# Patient Record
Sex: Female | Born: 1965
Health system: Southern US, Community
[De-identification: ages and names within clinical notes are randomized; demographics above are authoritative.]

## PROBLEM LIST (undated history)

## (undated) DIAGNOSIS — D649 Anemia, unspecified: Secondary | ICD-10-CM

## (undated) DIAGNOSIS — E785 Hyperlipidemia, unspecified: Secondary | ICD-10-CM

## (undated) HISTORY — DX: Anemia, unspecified: D64.9

## (undated) HISTORY — PX: WISDOM TOOTH EXTRACTION: SHX21

## (undated) HISTORY — PX: COSMETIC SURGERY: SHX468

## (undated) HISTORY — DX: Hyperlipidemia, unspecified: E78.5

---

## 2004-06-16 ENCOUNTER — Ambulatory Visit: Payer: Self-pay | Admitting: Family Medicine

## 2004-08-16 ENCOUNTER — Ambulatory Visit: Payer: Self-pay | Admitting: Family Medicine

## 2004-08-16 ENCOUNTER — Other Ambulatory Visit: Admission: RE | Admit: 2004-08-16 | Discharge: 2004-08-16 | Payer: Self-pay | Admitting: Family Medicine

## 2004-11-16 ENCOUNTER — Ambulatory Visit: Payer: Self-pay | Admitting: Family Medicine

## 2004-11-22 ENCOUNTER — Ambulatory Visit: Payer: Self-pay | Admitting: Family Medicine

## 2005-02-23 ENCOUNTER — Ambulatory Visit: Payer: Self-pay | Admitting: Family Medicine

## 2005-03-14 ENCOUNTER — Ambulatory Visit: Payer: Self-pay | Admitting: Family Medicine

## 2005-04-27 ENCOUNTER — Ambulatory Visit: Payer: Self-pay | Admitting: Family Medicine

## 2005-10-19 ENCOUNTER — Ambulatory Visit: Payer: Self-pay | Admitting: Family Medicine

## 2005-10-26 ENCOUNTER — Ambulatory Visit: Payer: Self-pay | Admitting: Family Medicine

## 2005-10-26 ENCOUNTER — Other Ambulatory Visit: Admission: RE | Admit: 2005-10-26 | Discharge: 2005-10-26 | Payer: Self-pay | Admitting: Family Medicine

## 2005-10-26 LAB — CONVERTED CEMR LAB: Pap Smear: NORMAL

## 2005-12-28 ENCOUNTER — Ambulatory Visit: Payer: Self-pay | Admitting: Family Medicine

## 2006-11-19 ENCOUNTER — Encounter: Payer: Self-pay | Admitting: Family Medicine

## 2006-11-19 DIAGNOSIS — N920 Excessive and frequent menstruation with regular cycle: Secondary | ICD-10-CM | POA: Insufficient documentation

## 2006-11-19 DIAGNOSIS — E7800 Pure hypercholesterolemia, unspecified: Secondary | ICD-10-CM | POA: Insufficient documentation

## 2006-11-19 DIAGNOSIS — D649 Anemia, unspecified: Secondary | ICD-10-CM | POA: Insufficient documentation

## 2006-12-20 ENCOUNTER — Ambulatory Visit: Payer: Self-pay | Admitting: Family Medicine

## 2006-12-20 ENCOUNTER — Other Ambulatory Visit: Admission: RE | Admit: 2006-12-20 | Discharge: 2006-12-20 | Payer: Self-pay | Admitting: Family Medicine

## 2006-12-20 ENCOUNTER — Encounter: Payer: Self-pay | Admitting: Family Medicine

## 2006-12-21 ENCOUNTER — Encounter: Payer: Self-pay | Admitting: Family Medicine

## 2006-12-26 ENCOUNTER — Encounter (INDEPENDENT_AMBULATORY_CARE_PROVIDER_SITE_OTHER): Payer: Self-pay | Admitting: *Deleted

## 2006-12-27 ENCOUNTER — Encounter (INDEPENDENT_AMBULATORY_CARE_PROVIDER_SITE_OTHER): Payer: Self-pay | Admitting: *Deleted

## 2007-12-26 ENCOUNTER — Other Ambulatory Visit: Admission: RE | Admit: 2007-12-26 | Discharge: 2007-12-26 | Payer: Self-pay | Admitting: Family Medicine

## 2007-12-26 ENCOUNTER — Ambulatory Visit: Payer: Self-pay | Admitting: Family Medicine

## 2007-12-26 ENCOUNTER — Encounter: Payer: Self-pay | Admitting: Family Medicine

## 2007-12-28 ENCOUNTER — Encounter: Payer: Self-pay | Admitting: Family Medicine

## 2008-01-07 ENCOUNTER — Encounter: Payer: Self-pay | Admitting: Family Medicine

## 2008-12-28 ENCOUNTER — Encounter: Payer: Self-pay | Admitting: Family Medicine

## 2008-12-28 ENCOUNTER — Other Ambulatory Visit: Admission: RE | Admit: 2008-12-28 | Discharge: 2008-12-28 | Payer: Self-pay | Admitting: Family Medicine

## 2008-12-28 ENCOUNTER — Ambulatory Visit: Payer: Self-pay | Admitting: Family Medicine

## 2008-12-30 ENCOUNTER — Encounter (INDEPENDENT_AMBULATORY_CARE_PROVIDER_SITE_OTHER): Payer: Self-pay | Admitting: *Deleted

## 2009-01-05 ENCOUNTER — Encounter (INDEPENDENT_AMBULATORY_CARE_PROVIDER_SITE_OTHER): Payer: Self-pay | Admitting: *Deleted

## 2009-01-05 ENCOUNTER — Encounter: Payer: Self-pay | Admitting: Family Medicine

## 2010-01-03 ENCOUNTER — Ambulatory Visit: Payer: Self-pay | Admitting: Family Medicine

## 2010-01-03 ENCOUNTER — Other Ambulatory Visit: Admission: RE | Admit: 2010-01-03 | Discharge: 2010-01-03 | Payer: Self-pay | Admitting: Family Medicine

## 2010-01-03 LAB — HM PAP SMEAR

## 2010-01-05 ENCOUNTER — Encounter (INDEPENDENT_AMBULATORY_CARE_PROVIDER_SITE_OTHER): Payer: Self-pay | Admitting: *Deleted

## 2010-01-10 ENCOUNTER — Encounter: Payer: Self-pay | Admitting: Family Medicine

## 2010-01-12 ENCOUNTER — Encounter: Payer: Self-pay | Admitting: Family Medicine

## 2010-01-19 ENCOUNTER — Telehealth: Payer: Self-pay | Admitting: Family Medicine

## 2010-01-24 ENCOUNTER — Encounter: Payer: Self-pay | Admitting: Family Medicine

## 2010-02-08 ENCOUNTER — Encounter (INDEPENDENT_AMBULATORY_CARE_PROVIDER_SITE_OTHER): Payer: Self-pay | Admitting: *Deleted

## 2010-02-08 ENCOUNTER — Ambulatory Visit: Payer: Self-pay | Admitting: Family Medicine

## 2010-03-08 ENCOUNTER — Ambulatory Visit
Admission: RE | Admit: 2010-03-08 | Discharge: 2010-03-08 | Payer: Self-pay | Source: Home / Self Care | Attending: Family Medicine | Admitting: Family Medicine

## 2010-03-14 ENCOUNTER — Telehealth: Payer: Self-pay | Admitting: Family Medicine

## 2010-03-15 ENCOUNTER — Encounter: Payer: Self-pay | Admitting: Family Medicine

## 2010-03-20 LAB — CONVERTED CEMR LAB
Pap Smear: NEGATIVE
Pap Smear: NORMAL

## 2010-03-22 NOTE — Progress Notes (Signed)
Summary: prior auth needed for lipitor  Phone Note From Pharmacy   Caller: [facility] Summary of Call: Prior Berkley Harvey is needed for lipitor, form is on your shelf. Initial call taken by: Lowella Petties CMA, AAMA,  January 19, 2010 4:05 PM  Follow-up for Phone Call        form done and in nurse in box  Follow-up by: Judith Part MD,  January 20, 2010 12:53 PM  Additional Follow-up for Phone Call Additional follow up Details #1::        Form faxed.               Lowella Petties CMA, AAMA  January 21, 2010 8:19 AM  Prior Berkley Harvey given for lipitor, approval note placed on doctor's shelf for signature and scanning. Additional Follow-up by: Lowella Petties CMA, AAMA,  January 24, 2010 12:30 PM

## 2010-03-22 NOTE — Letter (Signed)
Summary: Results Follow up Letter  [place] [facility]  [address] [place], [place] [address]   Phone: [phone]  Fax: [phone]    01/10/2010 MRN: [id number]    [facility] [address] [place], [place]  [address]    Dear Ms. SILVER,  The following are the results of your recent test(s):  Test         Result    Pap Smear:        Normal __X___  Not Normal _____ Comments: ______________________________________________________ Cholesterol: LDL(Bad cholesterol):         Your goal is less than:         HDL (Good cholesterol):       Your goal is more than: Comments:  ______________________________________________________ Mammogram:        Normal _____  Not Normal _____ Comments:  ___________________________________________________________________ Hemoccult:        Normal _____  Not normal _______ Comments:    _____________________________________________________________________ Other Tests:    We routinely do not discuss normal results over the telephone.  If you desire a copy of the results, or you have any questions about this information we can discuss them at your next office visit.   Sincerely,    Idamae Schuller Tower,MD  MT/ri

## 2010-03-22 NOTE — Medication Information (Signed)
Summary: Step Therapy Request for Lipitor/[facility]  Step Therapy Request for Lipitor/[facility]   Imported By: Lanelle Bal 01/20/2010 13:21:49  _____________________________________________________________________  External Attachment:    Type:   Image     Comment:   External Document

## 2010-03-22 NOTE — Assessment & Plan Note (Signed)
Summary: CPX W PAP/DLO   Vital Signs:  Patient profile:   45 year old female Height:      66.5 inches Weight:      183.50 pounds BMI:     29.28 Temp:     98.6 degrees F oral Pulse rate:   80 / minute Pulse rhythm:   regular BP sitting:   130 / 84  (left arm) Cuff size:   large  Vitals Entered By: Lewanda Rife LPN (January 03, 2010 9:47 AM) CC: CPX with pap and breast exam LMP 12/18/09   History of Present Illness: here for wellness exam and gyn care   felt fine until saturday  went on trip to [place] -- and exp to a lot of sick people  stuffy nose and headache -- taking nyquil  no cough yet -- but clears her throat     wt is up 2 lb  bp is 130/84  hx of anemia on nu iron -- cannot do mail order   hx of lipidemia on lipitor  will need labs done for [facility]  pap nl 11/10-- regular and not heavy or painful  no need for birth control   mam 11/10 has mam today-- needs ref   Td 07  flu shot -- had at work   Allergies (verified): No Known Drug Allergies  Past History:  Past Medical History: Last updated: 12/26/2007 Anemia-NOS hyperlipidemia  Past Surgical History: Last updated: 11/19/2006 Caesarean section  Family History: Last updated: 2006-12-25 Father: deceased age 68- stomach cancer Mother: HTN, anemia, chol Siblings great aunt with DM:   Social History: Last updated: 01/03/2010 Marital Status: Married Children: 3 Occupation: [employer] non smoker no alcohol walks 2 mi per day at work  Risk Factors: Smoking Status: never (11/19/2006)  Social History: Marital Status: Married Children: 3 Occupation: [employer] non smoker no alcohol walks 2 mi per day at work  Review of Systems General:  Denies fatigue, fever, loss of appetite, and malaise. Eyes:  Denies blurring and eye irritation. CV:  Denies chest pain or discomfort, lightheadness, and palpitations. Resp:  Denies cough and wheezing. GI:  Denies abdominal pain, bloody stools,  change in bowel habits, indigestion, nausea, and vomiting. GU:  Denies dysuria and urinary frequency. MS:  Denies joint pain, joint redness, joint swelling, and muscle weakness. Derm:  Denies itching, lesion(s), poor wound healing, and rash. Neuro:  Denies numbness and tingling. Psych:  Denies anxiety and depression. Endo:  Denies cold intolerance, excessive thirst, excessive urination, and heat intolerance. Heme:  Denies abnormal bruising and bleeding.  Physical Exam  General:  overweight but generally well appearing  Head:  Normocephalic and atraumatic without obvious abnormalities. mild r sinus tenderness maxillary Eyes:  vision grossly intact, pupils equal, pupils round, and pupils reactive to light.  no conjunctival pallor, injection or icterus  Ears:  R ear normal and L ear normal.   Nose:  nares are injected and congested bilaterally  Mouth:  pharynx pink and moist.  -- some clear post nasal drip Neck:  supple with full rom and no masses or thyromegally, no JVD or carotid bruit  Chest Wall:  No deformities, masses, or tenderness noted. Breasts:  No mass, nodules, thickening, tenderness, bulging, retraction, inflamation, nipple discharge or skin changes noted.   Lungs:  Normal respiratory effort, chest expands symmetrically. Lungs are clear to auscultation, no crackles or wheezes. Heart:  Normal rate and regular rhythm. S1 and S2 normal without gallop, murmur, click, rub or other extra sounds.  Abdomen:  Bowel sounds positive,abdomen soft and non-tender without masses, organomegaly or hernias noted. no renal bruits  Genitalia:  Normal introitus for age, no external lesions, no vaginal discharge, mucosa pink and moist, no vaginal or cervical lesions, no vaginal atrophy, no friaility or hemorrhage, normal uterus size and position, no adnexal masses or tenderness Msk:  No deformity or scoliosis noted of thoracic or lumbar spine.  no acute joint changes  Pulses:  R and L  carotid,radial,femoral,dorsalis pedis and posterior tibial pulses are full and equal bilaterally Extremities:  No clubbing, cyanosis, edema, or deformity noted with normal full range of motion of all joints.   Neurologic:  sensation intact to light touch, gait normal, and DTRs symmetrical and normal.   Skin:  Intact without suspicious lesions or rashes baseline skin tags neck and nevi on face Cervical Nodes:  No lymphadenopathy noted Axillary Nodes:  No palpable lymphadenopathy Inguinal Nodes:  No significant adenopathy Psych:  normal affect, talkative and pleasant    Impression & Recommendations:  Problem # 1:  HEALTH MAINTENANCE EXAM (ICD-V70.0) Assessment Comment Only  reviewed health habits including diet, exercise and skin cancer prevention reviewed health maintenance list and family history  lab today  Orders: Venipuncture ([address])  Problem # 2:  GYNECOLOGICAL EXAMINATION, ROUTINE (ICD-V72.31) Assessment: Comment Only annual exam with pap nl menses  no Oc if nl - will be able to start spacing out exams   Problem # 3:  ANEMIA-NOS (ICD-285.9) Assessment: Unchanged  continue current dose - from menses loss refil done lab through [facility] to draw today cbc  Her updated medication list for this problem includes:    Nu-iron 150 Mg Caps (Polysaccharide iron complex) ..... One by mouth once daily  Orders: Venipuncture ([address]) Prescription Created Electronically [phone])  Problem # 4:  Hx of HYPERCHOLESTEROLEMIA (ICD-272.0) Assessment: Unchanged  has been controlled with lipitor and diet  lab to [facility] today  rev low sat fat diet and exercise  Her updated medication list for this problem includes:    Lipitor 10 Mg Tabs (Atorvastatin calcium) ..... One by mouth once daily  Orders: Venipuncture ([address])  Problem # 5:  OTHER SCREENING MAMMOGRAM (ICD-V76.12) Assessment: Comment Only  annual mammogram scheduled adv pt to continue regular self breast exams non  remarkable breast exam today   Orders: Radiology Referral (Radiology)  Complete Medication List: 1)  Lipitor 10 Mg Tabs (Atorvastatin calcium) .... One by mouth once daily 2)  Nu-iron 150 Mg Caps (Polysaccharide iron complex) .... One by mouth once daily  Patient Instructions: 1)  please draw comp met, cbc with diff, tsh , lipids v70.0 and 272 and send to [facility] today  2)  you can try mucinex over the counter twice daily as directed and nasal saline spray for congestion 3)  tylenol over the counter as directed may help with aches, headache and fever 4)  call if symptoms worsen or if not improved in 4-5 days  5)  we will schedule mammogram at check out  Prescriptions: LIPITOR 10 MG  TABS (ATORVASTATIN CALCIUM) one by mouth once daily  #90 x 3   Entered and Authorized by:   Judith Part MD   Signed by:   Judith Part MD on 01/03/2010   Method used:   Print then Give to Patient   RxID:   [id number] NU-IRON 150 MG  CAPS (POLYSACCHARIDE IRON COMPLEX) one by mouth once daily  #30 x 11   Entered and Authorized by:   Colon Flattery  Tower MD   Signed by:   Judith Part MD on 01/03/2010   Method used:   Electronically to        [facility] [id number][facility] (retail)       [address] [place], [place]  [address]       Ph: [phone] or [phone]       Fax: [phone]   RxID:   [id number]    Orders Added: 1)  Radiology Referral [Radiology] 2)  Venipuncture [[address]] 3)  Prescription Created Electronically [G8553] 4)  Est. Patient 40-64 years [99396]   Immunization History:  Influenza Immunization History:    Influenza:  historical received at work (12/01/2009)   Immunization History:  Influenza Immunization History:    Influenza:  Historical received at work (12/01/2009)  Current Allergies (reviewed today): No known allergies   Appended Document: CPX W PAP/DLO

## 2010-03-22 NOTE — Letter (Signed)
Summary: Results Follow up Letter   at [facility]  [address] [place], [place] [address]   Phone: [phone]  Fax: [phone]    01/05/2010 MRN: [id number]     [facility] [address] [place], [place]  [address]    Dear Leslie Gomez,  The following are the results of your recent test(s):  Test         Result    Pap Smear:        Normal _____  Not Normal _____ Comments: ______________________________________________________ Cholesterol: LDL(Bad cholesterol):         Your goal is less than:         HDL (Good cholesterol):       Your goal is more than: Comments:  ______________________________________________________ Mammogram:        Normal __x___  Not Normal _____ Comments:Repeat in 1 year  ___________________________________________________________________ Hemoccult:        Normal _____  Not normal _______ Comments:    _____________________________________________________________________ Other Tests:    We routinely do not discuss normal results over the telephone.  If you desire a copy of the results, or you have any questions about this information we can discuss them at your next office visit.   Sincerely,  Roxy Manns MD

## 2010-03-24 NOTE — Medication Information (Signed)
Summary: Prior Authorization and Approval for Lipitor  Prior Authorization and Approval for Lipitor   Imported By: Maryln Gottron 01/31/2010 15:32:43  _____________________________________________________________________  External Attachment:    Type:   Image     Comment:   External Document

## 2010-03-24 NOTE — Progress Notes (Signed)
  Phone Note Outgoing Call   Call placed by: Terri Call placed to: Patient Summary of Call: FYI, Patient will be coming in tomorrow for CBC redraw, [facility] lost all blood samples from 03/08/2010, Terri Initial call taken by: Mills Koller,  March 14, 2010 3:24 PM  Follow-up for Phone Call        thanks for the update -- what's the deal with [facility] lately? Follow-up by: Judith Part MD,  March 14, 2010 4:22 PM  Additional Follow-up for Phone Call Additional follow up Details #1::        I don't know. I have a call in to our rep. Thanks, Camelia Eng

## 2010-03-24 NOTE — Letter (Signed)
Summary: [place] No Show Letter  [place] at [facility]  [address] [place], [place] [address]   Phone: [phone]  Fax: [phone]    02/08/2010 MRN: [id number]  [facility] [address] [place], [place]  [address]   Dear Ms. SILVER,   Our records indicate that you missed your scheduled appointment with ___lab__________________ on _12.20.2011___________.  Please contact this office to reschedule your appointment as soon as possible.  It is important that you keep your scheduled appointments with your physician, so we can provide you the best care possible.  Please be advised that there may be a charge for "no show" appointments.    Sincerely,   [place] at [facility]

## 2010-04-21 ENCOUNTER — Other Ambulatory Visit: Payer: Self-pay

## 2010-05-02 ENCOUNTER — Other Ambulatory Visit (INDEPENDENT_AMBULATORY_CARE_PROVIDER_SITE_OTHER): Payer: 59

## 2010-05-02 ENCOUNTER — Encounter: Payer: Self-pay | Admitting: Family Medicine

## 2010-05-02 ENCOUNTER — Encounter: Payer: Self-pay | Admitting: *Deleted

## 2010-05-02 DIAGNOSIS — D649 Anemia, unspecified: Secondary | ICD-10-CM

## 2011-01-02 ENCOUNTER — Telehealth: Payer: Self-pay | Admitting: *Deleted

## 2011-01-02 DIAGNOSIS — Z1231 Encounter for screening mammogram for malignant neoplasm of breast: Secondary | ICD-10-CM

## 2011-01-02 NOTE — Telephone Encounter (Signed)
Patient needs a referral to [facility] for her mammogram. Patient has an appointment scheduled for 01/16/11 for her mammogram. Please let her know that this has been done.

## 2011-01-03 NOTE — Telephone Encounter (Signed)
We do not use [facility] anymore (let person know who took message)- ask her if [facility] is ok or [place] thanks

## 2011-01-04 DIAGNOSIS — Z1231 Encounter for screening mammogram for malignant neoplasm of breast: Secondary | ICD-10-CM | POA: Insufficient documentation

## 2011-01-04 NOTE — Telephone Encounter (Signed)
Patient notified as instructed by telephone. Pt said she would go to [facility] at [facility] but she has taken the week of 01/16/11 off to take care of doctors appts and testing that needs to be done and pt wants mammogram appt that week. . Pt will wait to hear from pt care coordinator.Pt can be reached at [phone].

## 2011-01-04 NOTE — Telephone Encounter (Signed)
Will do mam ref

## 2011-01-09 ENCOUNTER — Telehealth: Payer: Self-pay | Admitting: Family Medicine

## 2011-01-09 DIAGNOSIS — E7800 Pure hypercholesterolemia, unspecified: Secondary | ICD-10-CM

## 2011-01-09 DIAGNOSIS — Z0000 Encounter for general adult medical examination without abnormal findings: Secondary | ICD-10-CM

## 2011-01-09 NOTE — Telephone Encounter (Signed)
Message copied by Judy Pimple on Mon Jan 09, 2011  9:39 PM ------      Message from: Alvina Chou      Created: Mon Jan 09, 2011  9:03 AM      Regarding: Labs for wednesday 11.21.12       Patient is scheduled for CPX labs, please order future labs, Thanks , Camelia Eng

## 2011-01-11 ENCOUNTER — Other Ambulatory Visit (INDEPENDENT_AMBULATORY_CARE_PROVIDER_SITE_OTHER): Payer: 59

## 2011-01-11 DIAGNOSIS — E7800 Pure hypercholesterolemia, unspecified: Secondary | ICD-10-CM

## 2011-01-11 DIAGNOSIS — Z0000 Encounter for general adult medical examination without abnormal findings: Secondary | ICD-10-CM

## 2011-01-11 DIAGNOSIS — D649 Anemia, unspecified: Secondary | ICD-10-CM

## 2011-01-16 ENCOUNTER — Encounter: Payer: Self-pay | Admitting: Family Medicine

## 2011-01-16 ENCOUNTER — Encounter: Payer: 59 | Admitting: Family Medicine

## 2011-01-17 ENCOUNTER — Other Ambulatory Visit (HOSPITAL_COMMUNITY)
Admission: RE | Admit: 2011-01-17 | Discharge: 2011-01-17 | Disposition: A | Discharge status: Home / Self Care | Payer: 59 | Source: Ambulatory Visit | Attending: Family Medicine | Admitting: Family Medicine

## 2011-01-17 ENCOUNTER — Encounter: Payer: Self-pay | Admitting: Family Medicine

## 2011-01-17 ENCOUNTER — Ambulatory Visit (INDEPENDENT_AMBULATORY_CARE_PROVIDER_SITE_OTHER): Payer: 59 | Admitting: Family Medicine

## 2011-01-17 VITALS — BP 130/84 | HR 72 | Temp 98.3°F | Ht 66.5 in | Wt 183.0 lb

## 2011-01-17 DIAGNOSIS — D649 Anemia, unspecified: Secondary | ICD-10-CM

## 2011-01-17 DIAGNOSIS — Z01419 Encounter for gynecological examination (general) (routine) without abnormal findings: Secondary | ICD-10-CM | POA: Insufficient documentation

## 2011-01-17 DIAGNOSIS — Z1231 Encounter for screening mammogram for malignant neoplasm of breast: Secondary | ICD-10-CM

## 2011-01-17 DIAGNOSIS — E7800 Pure hypercholesterolemia, unspecified: Secondary | ICD-10-CM

## 2011-01-17 DIAGNOSIS — K219 Gastro-esophageal reflux disease without esophagitis: Secondary | ICD-10-CM

## 2011-01-17 DIAGNOSIS — Z0000 Encounter for general adult medical examination without abnormal findings: Secondary | ICD-10-CM

## 2011-01-17 MED ORDER — ATORVASTATIN CALCIUM 10 MG PO TABS: 10.0000 mg | ORAL_TABLET | Freq: Every day | ORAL | Status: DC

## 2011-01-17 MED ORDER — RANITIDINE HCL 150 MG PO TABS: 150.0000 mg | ORAL_TABLET | Freq: Two times a day (BID) | ORAL | Status: DC

## 2011-01-17 MED ORDER — POLYSACCHARIDE IRON COMPLEX 150 MG PO CAPS: 150.0000 mg | ORAL_CAPSULE | Freq: Two times a day (BID) | ORAL | Status: DC

## 2011-01-17 NOTE — Patient Instructions (Signed)
Take the zantac 150 mg twice daily for heartburn  If not improved- let me know Cholesterol is great  Please try to quit sodas Anemia is the same -- if at any time you want to see hematology -- let me know  Follow up with me in 3 months for the heartburn problem

## 2011-01-17 NOTE — Assessment & Plan Note (Signed)
Mam utd - pend report from yesterday  Nl breast exam  Enc self exams

## 2011-01-17 NOTE — Assessment & Plan Note (Signed)
Strongly suspect iron abs issue -as this runs in family Pt declines any further eval as she feels fine - I did enc her to call if she changes her mind Will continue to follow  Continue nu iron

## 2011-01-17 NOTE — Assessment & Plan Note (Signed)
Reviewed health habits including diet and exercise and skin cancer prevention Also reviewed health mt list, fam hx and immunizations  Rev wellness lab in detail 

## 2011-01-17 NOTE — Assessment & Plan Note (Signed)
New gerd symptoms - has fam hx of stomach cancer - so emph imp of tx this  Will start zantac 150 bid and f/u 3 mo (call if not imp before then) Also plans to quit soda completely and work on wt loss

## 2011-01-17 NOTE — Assessment & Plan Note (Signed)
Good control with lipitor and diet  Rev labs from [facility] Disc low sat fat diet  Refilled lipitor

## 2011-01-17 NOTE — Assessment & Plan Note (Signed)
Annual exam with pap Menses not as heavy Does not want OC

## 2011-01-17 NOTE — Progress Notes (Signed)
Subjective:    Patient ID: Leslie Gomez, female    DOB: May 09, 1965, 45 y.o.   MRN: [id number]  HPI Here for annual health mt exam/gyn care and also to rev chronic med probs incl hyperlipidemia and anemia  Is feeling good overall   New problem-- getting heartburn more often -- no pattern to it  Never had it before  Does not think she is having it more than twice per week Father had stomach cancer She takes excedrin at most one dose every 2 week  Diet has not changed No spicy food  Does drink mt dew and pepsi -- regular  Is not burping up acid or tasting in her mouth  Feels it in chest up to her neck - burning feeling  Does not clear throat a lot of cough  Some stress- life changes , kids getting older    bp is 130/84 Wt is stable with bmi of 29  Hx of heavy menses Still the same - last 3 days maxiumum -- every once in a while quite heavy Pap nl 11/11  Mam 11/10 normal -- says she did have one last year  Self exam   Flu shot Td 07  On lipitor  Lipids well controlled Diet HDL was 57 andLDL 74   On nu iron hb is stable at 10.8-- per pt that is not bad  Offered heme consult in past to investigate poss iron abs disorder  Mother has iron abs problem  She does not want to investigate hers   Patient Active Problem List  Diagnoses  . HYPERCHOLESTEROLEMIA  . ANEMIA-NOS  . EXCESSIVE MENSTRUATION  . Other screening mammogram  . Routine general medical examination at a health care facility  . Gynecological examination  . GERD (gastroesophageal reflux disease)   Past Medical History  Diagnosis Date  . Anemia     NOS since childhood runs in family  . Hyperlipidemia    Past Surgical History  Procedure Date  . Cesarean section    History  Substance Use Topics  . Smoking status: Never Smoker   . Smokeless tobacco: Not on file  . Alcohol Use: No   Family History  Problem Relation Age of Onset  . Hyperlipidemia Mother   . Hypertension Mother   . Anemia Mother     . Cancer Father     stomach CA   No Known Allergies No current outpatient prescriptions on file prior to visit.       Review of Systems Review of Systems  Constitutional: Negative for fever, appetite change, fatigue and unexpected weight change.  Eyes: Negative for pain and visual disturbance.  Respiratory: Negative for cough and shortness of breath.   Cardiovascular: Negative for cp or palpitations    Gastrointestinal: Negative for nausea, diarrhea and constipation. pos for heartburn Genitourinary: Negative for urgency and frequency.  Skin: Negative for pallor or rash   Neurological: Negative for weakness, light-headedness, numbness and headaches.  Hematological: Negative for adenopathy. Does not bruise/bleed easily.  Psychiatric/Behavioral: Negative for dysphoric mood. The patient is not nervous/anxious.          Objective:   Physical Exam  Constitutional: She appears well-developed and well-nourished. No distress.  HENT:  Head: Normocephalic and atraumatic.  Right Ear: External ear normal.  Left Ear: External ear normal.  Nose: Nose normal.  Mouth/Throat: Oropharynx is clear and moist.  Eyes: Conjunctivae and EOM are normal. Pupils are equal, round, and reactive to light.  Neck: Normal range of motion. Neck  supple. No JVD present. Carotid bruit is not present. No thyromegaly present.  Cardiovascular: Normal rate, regular rhythm, normal heart sounds and intact distal pulses.  Exam reveals no gallop.   Pulmonary/Chest: Effort normal and breath sounds normal. No respiratory distress. She has no wheezes.  Abdominal: Soft. Bowel sounds are normal. She exhibits no distension, no abdominal bruit and no mass. There is no tenderness.  Genitourinary: Vagina normal and uterus normal. No breast swelling, tenderness, discharge or bleeding. No vaginal discharge found.       No M on breast exam  Lipoma like lumps in bilat axillae- baseline    Musculoskeletal: Normal range of motion.  She exhibits no edema and no tenderness.  Lymphadenopathy:    She has no cervical adenopathy.  Neurological: She is alert. She has normal reflexes. No cranial nerve deficit. She exhibits normal muscle tone. Coordination normal.  Skin: Skin is warm and dry. No rash noted. No erythema. No pallor.  Psychiatric: She has a normal mood and affect.          Assessment & Plan:

## 2011-01-26 ENCOUNTER — Encounter: Payer: Self-pay | Admitting: *Deleted

## 2011-04-06 ENCOUNTER — Encounter: Payer: Self-pay | Admitting: Family Medicine

## 2011-04-12 ENCOUNTER — Encounter: Payer: Self-pay | Admitting: Family Medicine

## 2011-04-12 ENCOUNTER — Ambulatory Visit (INDEPENDENT_AMBULATORY_CARE_PROVIDER_SITE_OTHER): Payer: 59 | Admitting: Family Medicine

## 2011-04-12 VITALS — BP 134/84 | HR 80 | Temp 98.4°F | Ht 66.5 in | Wt 184.2 lb

## 2011-04-12 DIAGNOSIS — K219 Gastro-esophageal reflux disease without esophagitis: Secondary | ICD-10-CM

## 2011-04-12 DIAGNOSIS — E669 Obesity, unspecified: Secondary | ICD-10-CM | POA: Insufficient documentation

## 2011-04-12 NOTE — Assessment & Plan Note (Signed)
Controlled with no sodas  Has not needed zantac  Wt loss will also help Rev lifestyle change

## 2011-04-12 NOTE — Progress Notes (Signed)
Subjective:    Patient ID: Leslie Gomez, female    DOB: [birth year], 46 y.o.   MRN: [id number]  HPI Here for f/u of acid reflux  No more symtoms at all Never started the zantac -- she just quit sodas completely  Misses them but doing well   No wt change  Wants to loose Is starting to change what she eats - more fruit and veg  Has not cut portions yet  Does not snack Needs to exercise   Patient Active Problem List  Diagnoses  . HYPERCHOLESTEROLEMIA  . ANEMIA-NOS  . EXCESSIVE MENSTRUATION  . Other screening mammogram  . Routine general medical examination at a health care facility  . Gynecological examination  . GERD (gastroesophageal reflux disease)  . Obesity   Past Medical History  Diagnosis Date  . Anemia     NOS since childhood runs in family  . Hyperlipidemia    Past Surgical History  Procedure Date  . Cesarean section    History  Substance Use Topics  . Smoking status: Never Smoker   . Smokeless tobacco: Not on file  . Alcohol Use: No   Family History  Problem Relation Age of Onset  . Hyperlipidemia Mother   . Hypertension Mother   . Anemia Mother   . Cancer Father     stomach CA   No Known Allergies Current Outpatient Prescriptions on File Prior to Visit  Medication Sig Dispense Refill  . atorvastatin (LIPITOR) 10 MG tablet Take 1 tablet (10 mg total) by mouth daily.  90 tablet  3  . iron polysaccharides (NU-IRON) 150 MG capsule Take 1 capsule (150 mg total) by mouth 2 (two) times daily.  180 capsule  3  . ranitidine (ZANTAC) 150 MG tablet Take 1 tablet (150 mg total) by mouth 2 (two) times daily.  60 tablet  11        Review of Systems Review of Systems  Constitutional: Negative for fever, appetite change, fatigue and unexpected weight change.  Eyes: Negative for pain and visual disturbance.  Respiratory: Negative for cough and shortness of breath.   Cardiovascular: Negative for cp or palpitations    Gastrointestinal: Negative for nausea,  diarrhea and constipation.neg for heartburn or epigastric pain   Genitourinary: Negative for urgency and frequency.  Skin: Negative for pallor or rash   Neurological: Negative for weakness, light-headedness, numbness and headaches.  Hematological: Negative for adenopathy. Does not bruise/bleed easily.  Psychiatric/Behavioral: Negative for dysphoric mood. The patient is not nervous/anxious.          Objective:   Physical Exam  Constitutional: She appears well-developed and well-nourished. No distress.       Obese and well appearing   HENT:  Head: Normocephalic and atraumatic.  Mouth/Throat: Oropharynx is clear and moist.  Eyes: Conjunctivae and EOM are normal. Pupils are equal, round, and reactive to light. No scleral icterus.  Neck: Normal range of motion. Neck supple. No JVD present. No thyromegaly present.  Cardiovascular: Normal rate, regular rhythm and normal heart sounds.   Pulmonary/Chest: Effort normal and breath sounds normal. No respiratory distress. She has no wheezes.  Abdominal: Soft. Bowel sounds are normal. She exhibits no distension and no mass. There is no tenderness.  Musculoskeletal: She exhibits no edema.  Lymphadenopathy:    She has no cervical adenopathy.  Neurological: She is alert.  Skin: Skin is warm and dry. No rash noted. No erythema. No pallor.  Psychiatric: She has a normal mood and affect.  Assessment & Plan:

## 2011-04-12 NOTE — Patient Instructions (Signed)
I'm glad your reflux is better  Stay off soda For weight loss- eat smart (no junk), cut portions by 1/4 and also incorporate exercise 5 days per week  Weight watchers is a good option  There are also some good apps - like my fitness pal Update me if symptoms return  Use zantac as needed

## 2011-04-12 NOTE — Assessment & Plan Note (Signed)
Discussed how this problem influences overall health and the risks it imposes  Reviewed plan for weight loss with lower calorie diet (via better food choices and also portion control or program like weight watchers) and exercise building up to or more than 30 minutes 5 days per week including some aerobic activity   Pt is motivated for change

## 2011-04-19 ENCOUNTER — Ambulatory Visit: Payer: 59 | Admitting: Family Medicine

## 2011-08-29 ENCOUNTER — Telehealth: Payer: Self-pay

## 2011-08-29 DIAGNOSIS — Z1231 Encounter for screening mammogram for malignant neoplasm of breast: Secondary | ICD-10-CM

## 2011-08-29 NOTE — Telephone Encounter (Signed)
Not sure if they will accept ref this far ahead of time- but will do it and see

## 2011-08-29 NOTE — Telephone Encounter (Signed)
Pt request referral for mammogram in Dec 2013. Pt will wait to hear from pt care coordinator about appt.Please advise.

## 2012-01-21 ENCOUNTER — Telehealth: Payer: Self-pay | Admitting: Family Medicine

## 2012-01-21 DIAGNOSIS — Z0000 Encounter for general adult medical examination without abnormal findings: Secondary | ICD-10-CM

## 2012-01-21 DIAGNOSIS — E7800 Pure hypercholesterolemia, unspecified: Secondary | ICD-10-CM

## 2012-01-21 DIAGNOSIS — D649 Anemia, unspecified: Secondary | ICD-10-CM

## 2012-01-21 NOTE — Telephone Encounter (Signed)
Message copied by Judy Pimple on Sun Jan 21, 2012 10:07 AM ------      Message from: Leslie Gomez      Created: Thu Jan 11, 2012 10:32 AM      Regarding: Cpx labs 12/2 Mon       Please order  future cpx labs for pt's upcomming lab appt.      Thanks      Rodney Booze

## 2012-01-22 ENCOUNTER — Other Ambulatory Visit (INDEPENDENT_AMBULATORY_CARE_PROVIDER_SITE_OTHER): Payer: 59

## 2012-01-22 DIAGNOSIS — D649 Anemia, unspecified: Secondary | ICD-10-CM

## 2012-01-22 DIAGNOSIS — E7800 Pure hypercholesterolemia, unspecified: Secondary | ICD-10-CM

## 2012-01-22 DIAGNOSIS — Z0000 Encounter for general adult medical examination without abnormal findings: Secondary | ICD-10-CM

## 2012-01-23 LAB — COMPREHENSIVE METABOLIC PANEL
Albumin: 4.4 g/dL (ref 3.5–5.5)
BUN: 15 mg/dL (ref 6–24)
CO2: 20 mmol/L (ref 19–28)
Chloride: 102 mmol/L (ref 97–108)
Creatinine, Ser: 0.83 mg/dL (ref 0.57–1.00)
GFR calc Af Amer: 98 mL/min/{1.73_m2} (ref >59)
Globulin, Total: 2.9 g/dL (ref 1.5–4.5)
Glucose: 89 mg/dL (ref 65–99)
Total Protein: 7.3 g/dL (ref 6.0–8.5)

## 2012-01-23 LAB — FERRITIN: Ferritin: 23 ng/mL (ref 15–150)

## 2012-01-23 LAB — LIPID PANEL WITH LDL/HDL RATIO
Cholesterol, Total: 173 mg/dL (ref 100–199)
HDL: 74 mg/dL
LDL Calculated: 88 mg/dL (ref 0–99)
LDl/HDL Ratio: 1.2 ratio (ref 0.0–3.2)
Triglycerides: 53 mg/dL (ref 0–149)
VLDL Cholesterol Cal: 11 mg/dL (ref 5–40)

## 2012-01-23 LAB — CBC WITH DIFFERENTIAL/PLATELET
Basophils Absolute: 0 10*3/uL (ref 0.0–0.2)
Eos: 1 % (ref 0–5)
HCT: 35.8 % (ref 34.0–46.6)
Hemoglobin: 11.1 g/dL (ref 11.1–15.9)
Immature Granulocytes: 0 % (ref 0–2)
Lymphocytes Absolute: 1.9 10*3/uL (ref 0.7–3.1)
Lymphs: 32 % (ref 14–46)
MCHC: 31 g/dL — ABNORMAL LOW (ref 31.5–35.7)
Monocytes: 5 % (ref 4–12)
Neutrophils Absolute: 3.6 10*3/uL (ref 1.4–7.0)
WBC: 5.8 10*3/uL (ref 3.4–10.8)

## 2012-01-23 LAB — TSH: TSH: 2.35 u[IU]/mL (ref 0.450–4.500)

## 2012-01-24 ENCOUNTER — Ambulatory Visit: Payer: Self-pay | Admitting: Family Medicine

## 2012-01-24 ENCOUNTER — Encounter: Payer: Self-pay | Admitting: Family Medicine

## 2012-01-24 ENCOUNTER — Ambulatory Visit (INDEPENDENT_AMBULATORY_CARE_PROVIDER_SITE_OTHER): Payer: 59 | Admitting: Family Medicine

## 2012-01-24 ENCOUNTER — Ambulatory Visit: Payer: 59

## 2012-01-24 ENCOUNTER — Other Ambulatory Visit (HOSPITAL_COMMUNITY)
Admission: RE | Admit: 2012-01-24 | Discharge: 2012-01-24 | Disposition: A | Discharge status: Home / Self Care | Payer: 59 | Source: Ambulatory Visit | Attending: Family Medicine | Admitting: Family Medicine

## 2012-01-24 VITALS — BP 132/84 | HR 68 | Temp 99.1°F | Ht 66.5 in | Wt 162.8 lb

## 2012-01-24 DIAGNOSIS — Z01419 Encounter for gynecological examination (general) (routine) without abnormal findings: Secondary | ICD-10-CM | POA: Insufficient documentation

## 2012-01-24 DIAGNOSIS — Z0000 Encounter for general adult medical examination without abnormal findings: Secondary | ICD-10-CM

## 2012-01-24 DIAGNOSIS — E7800 Pure hypercholesterolemia, unspecified: Secondary | ICD-10-CM

## 2012-01-24 DIAGNOSIS — D649 Anemia, unspecified: Secondary | ICD-10-CM

## 2012-01-24 DIAGNOSIS — Z1231 Encounter for screening mammogram for malignant neoplasm of breast: Secondary | ICD-10-CM

## 2012-01-24 MED ORDER — ATORVASTATIN CALCIUM 10 MG PO TABS: 10.0000 mg | ORAL_TABLET | Freq: Every day | ORAL | Status: DC

## 2012-01-24 MED ORDER — POLYSACCHARIDE IRON COMPLEX 150 MG PO CAPS: 150.0000 mg | ORAL_CAPSULE | Freq: Two times a day (BID) | ORAL | Status: DC

## 2012-01-24 NOTE — Progress Notes (Signed)
Subjective:    Patient ID: Leslie Gomez, female    DOB: 06-Nov-1965, 46 y.o.   MRN: [id number]  HPI Here for health maintenance exam and to review chronic medical problems    Feeling ok  Nothing new going on   Wt is down 22 lb since last feb Has been working hard on it  Total loss 27 lb  Using my fitness pal app-- tracking calories and walks 2-3 miles per day Is very pleased and feels better  No longer obese bmi is 25  Flu vaccine- had it in oct   Pap 11/12- doing that today Menses- are not as heavy as they were , and regular  No OC -and does not want to be Not trying to get pregnant   mammo 11/12 Has today at the [facility]  Self exam- no lumps or changes   Hyperlipidemia  On lipitor and diet No results found for this basename: CHOL   Lab Results  Component Value Date   HDL 74 01/22/2012   Lab Results  Component Value Date   LDLCALC 88 01/22/2012   Lab Results  Component Value Date   TRIG 53 01/22/2012   No results found for this basename: CHOLHDL   No results found for this basename: LDLDIRECT     Hx of anemia Lab Results  Component Value Date   WBC 5.8 01/22/2012   HGB 11.1 01/22/2012   HCT 35.8 01/22/2012   MCV 78* 01/22/2012   is still taking her iron Feels fine  Ferritin 23  Patient Active Problem List  Diagnosis  . HYPERCHOLESTEROLEMIA  . ANEMIA-NOS  . EXCESSIVE MENSTRUATION  . Other screening mammogram  . Routine general medical examination at a health care facility  . Routine gynecological examination  . GERD (gastroesophageal reflux disease)   Past Medical History  Diagnosis Date  . Anemia     NOS since childhood runs in family  . Hyperlipidemia    Past Surgical History  Procedure Date  . Cesarean section    History  Substance Use Topics  . Smoking status: Never Smoker   . Smokeless tobacco: Not on file  . Alcohol Use: No   Family History  Problem Relation Age of Onset  . Hyperlipidemia Mother   . Hypertension  Mother   . Anemia Mother   . Cancer Father     stomach CA   No Known Allergies Current Outpatient Prescriptions on File Prior to Visit  Medication Sig Dispense Refill  . atorvastatin (LIPITOR) 10 MG tablet Take 1 tablet (10 mg total) by mouth daily.  90 tablet  3  . iron polysaccharides (NU-IRON) 150 MG capsule Take 1 capsule (150 mg total) by mouth 2 (two) times daily.  180 capsule  3  . ranitidine (ZANTAC) 150 MG tablet Take 1 tablet (150 mg total) by mouth 2 (two) times daily.  60 tablet  11       Review of Systems Review of Systems  Constitutional: Negative for fever, appetite change, fatigue and unexpected weight change.  Eyes: Negative for pain and visual disturbance.  Respiratory: Negative for cough and shortness of breath.   Cardiovascular: Negative for cp or palpitations    Gastrointestinal: Negative for nausea, diarrhea and constipation.  Genitourinary: Negative for urgency and frequency.  Skin: Negative for pallor or rash   Neurological: Negative for weakness, light-headedness, numbness and headaches.  Hematological: Negative for adenopathy. Does not bruise/bleed easily.  Psychiatric/Behavioral: Negative for dysphoric mood. The patient is not nervous/anxious.  Objective:   Physical Exam  Constitutional: She appears well-developed and well-nourished. No distress.  HENT:  Head: Normocephalic and atraumatic.  Right Ear: External ear normal.  Left Ear: External ear normal.  Nose: Nose normal.  Mouth/Throat: Oropharynx is clear and moist.  Eyes: Conjunctivae normal and EOM are normal. Pupils are equal, round, and reactive to light. Right eye exhibits no discharge. Left eye exhibits no discharge.  Neck: Normal range of motion. Neck supple. No JVD present. Carotid bruit is not present. No thyromegaly present.  Cardiovascular: Normal rate, regular rhythm, normal heart sounds and intact distal pulses.  Exam reveals no gallop.   Pulmonary/Chest: Effort normal and  breath sounds normal. No respiratory distress. She has no wheezes.  Abdominal: Soft. Bowel sounds are normal. She exhibits no distension, no abdominal bruit and no mass. There is no tenderness.  Genitourinary: Vagina normal and uterus normal. No breast swelling, tenderness, discharge or bleeding. There is no rash, tenderness or lesion on the right labia. There is no rash, tenderness or lesion on the left labia. Uterus is not enlarged and not tender. Cervix exhibits no motion tenderness, no discharge and no friability. Right adnexum displays no mass, no tenderness and no fullness. Left adnexum displays no mass, no tenderness and no fullness. No bleeding around the vagina. No vaginal discharge found.       Breast exam: No mass, nodules, thickening, tenderness, bulging, retraction, inflamation, nipple discharge or skin changes noted.  No axillary or clavicular LA.  Chaperoned exam.    Musculoskeletal: She exhibits no edema and no tenderness.  Lymphadenopathy:    She has no cervical adenopathy.  Neurological: She is alert. She has normal reflexes. No cranial nerve deficit. She exhibits normal muscle tone. Coordination normal.  Skin: Skin is warm and dry. No rash noted. No erythema. No pallor.  Psychiatric: She has a normal mood and affect.          Assessment & Plan:

## 2012-01-24 NOTE — Assessment & Plan Note (Signed)
Pt has mammo scheduled today Nl breast exam Enc monthly self exams

## 2012-01-24 NOTE — Assessment & Plan Note (Signed)
Reviewed health habits including diet and exercise and skin cancer prevention Also reviewed health mt list, fam hx and immunizations   Reviewed wellness labs Commended wt loss with healthy diet and exercise

## 2012-01-24 NOTE — Patient Instructions (Signed)
You are doing great  Keep up the great work with healthy diet and exercise  Continue current medicines

## 2012-01-24 NOTE — Assessment & Plan Note (Signed)
Annual exam with pap No problems  Menses are not as bad now  Anemia is stable on iron

## 2012-01-24 NOTE — Assessment & Plan Note (Signed)
Lipids in very good control with statin and diet  Disc goals for lipids and reasons to control them Rev labs with pt Rev low sat fat diet in detail

## 2012-01-29 ENCOUNTER — Encounter: Payer: Self-pay | Admitting: *Deleted

## 2012-01-31 ENCOUNTER — Encounter: Payer: Self-pay | Admitting: *Deleted

## 2012-02-09 ENCOUNTER — Encounter: Payer: Self-pay | Admitting: Family Medicine

## 2012-07-10 ENCOUNTER — Other Ambulatory Visit: Payer: Self-pay

## 2012-07-10 ENCOUNTER — Telehealth: Payer: Self-pay

## 2012-07-10 DIAGNOSIS — Z1231 Encounter for screening mammogram for malignant neoplasm of breast: Secondary | ICD-10-CM

## 2012-07-10 NOTE — Telephone Encounter (Signed)
Pt request mammogram appt on 01/27/13 at [facility]. Order printed and given to Jefm Bryant and she will send to [facility]. Pt will call for appt and if any problems pt will call our office back.

## 2012-12-05 ENCOUNTER — Other Ambulatory Visit: Payer: Self-pay

## 2013-01-12 ENCOUNTER — Telehealth: Payer: Self-pay | Admitting: Family Medicine

## 2013-01-12 DIAGNOSIS — Z0000 Encounter for general adult medical examination without abnormal findings: Secondary | ICD-10-CM

## 2013-01-12 NOTE — Telephone Encounter (Signed)
Message copied by Judy Pimple on Sun Jan 12, 2013  4:21 PM ------      Message from: Alvina Chou      Created: Fri Jan 10, 2013 11:54 AM      Regarding: Lab orders for Monday, 12.1.14       Patient is scheduled for CPX labs, please order future labs, Thanks , Terri       ------

## 2013-01-20 ENCOUNTER — Other Ambulatory Visit (INDEPENDENT_AMBULATORY_CARE_PROVIDER_SITE_OTHER): Payer: 59

## 2013-01-20 DIAGNOSIS — Z0000 Encounter for general adult medical examination without abnormal findings: Secondary | ICD-10-CM

## 2013-01-21 LAB — COMPREHENSIVE METABOLIC PANEL
Albumin: 4.2 g/dL (ref 3.5–5.5)
Alkaline Phosphatase: 61 IU/L (ref 39–117)
BUN/Creatinine Ratio: 13 (ref 9–23)
CO2: 22 mmol/L (ref 18–29)
Calcium: 9 mg/dL (ref 8.7–10.2)
GFR calc Af Amer: 99 mL/min/{1.73_m2} (ref >59)
GFR calc non Af Amer: 85 mL/min/{1.73_m2} (ref >59)
Globulin, Total: 2.6 g/dL (ref 1.5–4.5)
Glucose: 94 mg/dL (ref 65–99)
Sodium: 141 mmol/L (ref 134–144)

## 2013-01-21 LAB — LIPID PANEL
Chol/HDL Ratio: 2.7 ratio units (ref 0.0–4.4)
HDL: 67 mg/dL (ref >39)
Triglycerides: 48 mg/dL (ref 0–149)
VLDL Cholesterol Cal: 10 mg/dL (ref 5–40)

## 2013-01-21 LAB — CBC WITH DIFFERENTIAL/PLATELET
Basos: 0 %
Eos: 2 %
Eosinophils Absolute: 0.1 10*3/uL (ref 0.0–0.4)
Hemoglobin: 10.8 g/dL — ABNORMAL LOW (ref 11.1–15.9)
Lymphs: 34 %
MCH: 24.4 pg — ABNORMAL LOW (ref 26.6–33.0)
MCHC: 32.3 g/dL (ref 31.5–35.7)
MCV: 76 fL — ABNORMAL LOW (ref 79–97)
Monocytes Absolute: 0.3 10*3/uL (ref 0.1–0.9)
Neutrophils Absolute: 2.8 10*3/uL (ref 1.4–7.0)
Neutrophils Relative %: 58 %
RBC: 4.42 x10E6/uL (ref 3.77–5.28)

## 2013-01-21 LAB — TSH: TSH: 1.73 u[IU]/mL (ref 0.450–4.500)

## 2013-01-24 ENCOUNTER — Encounter: Payer: 59 | Admitting: Family Medicine

## 2013-01-27 ENCOUNTER — Ambulatory Visit
Admission: RE | Admit: 2013-01-27 | Discharge: 2013-01-27 | Disposition: A | Discharge status: Home / Self Care | Payer: 59 | Source: Ambulatory Visit

## 2013-01-27 ENCOUNTER — Ambulatory Visit (INDEPENDENT_AMBULATORY_CARE_PROVIDER_SITE_OTHER): Payer: 59 | Admitting: Family Medicine

## 2013-01-27 ENCOUNTER — Other Ambulatory Visit (HOSPITAL_COMMUNITY)
Admission: RE | Admit: 2013-01-27 | Discharge: 2013-01-27 | Disposition: A | Discharge status: Home / Self Care | Payer: 59 | Source: Ambulatory Visit | Attending: Family Medicine | Admitting: Family Medicine

## 2013-01-27 ENCOUNTER — Encounter: Payer: Self-pay | Admitting: Family Medicine

## 2013-01-27 VITALS — BP 130/80 | HR 60 | Temp 98.5°F | Ht 66.5 in | Wt 159.2 lb

## 2013-01-27 DIAGNOSIS — Z01419 Encounter for gynecological examination (general) (routine) without abnormal findings: Secondary | ICD-10-CM

## 2013-01-27 DIAGNOSIS — Z1231 Encounter for screening mammogram for malignant neoplasm of breast: Secondary | ICD-10-CM

## 2013-01-27 DIAGNOSIS — D649 Anemia, unspecified: Secondary | ICD-10-CM

## 2013-01-27 DIAGNOSIS — Z0000 Encounter for general adult medical examination without abnormal findings: Secondary | ICD-10-CM

## 2013-01-27 DIAGNOSIS — E7800 Pure hypercholesterolemia, unspecified: Secondary | ICD-10-CM

## 2013-01-27 DIAGNOSIS — Z1151 Encounter for screening for human papillomavirus (HPV): Secondary | ICD-10-CM | POA: Insufficient documentation

## 2013-01-27 MED ORDER — POLYSACCHARIDE IRON COMPLEX 150 MG PO CAPS: 150.0000 mg | ORAL_CAPSULE | Freq: Two times a day (BID) | ORAL | Status: DC

## 2013-01-27 MED ORDER — ATORVASTATIN CALCIUM 10 MG PO TABS: 10.0000 mg | ORAL_TABLET | Freq: Every day | ORAL | Status: DC

## 2013-01-27 NOTE — Assessment & Plan Note (Signed)
Reviewed health habits including diet and exercise and skin cancer prevention Reviewed appropriate screening tests for age  Also reviewed health mt list, fam hx and immunization status , as well as social and family history   Wellness lab reviewed  

## 2013-01-27 NOTE — Assessment & Plan Note (Signed)
Disc goals for lipids and reasons to control them Rev labs with pt Rev low sat fat diet in detail  Good control with lipitor and diet  

## 2013-01-27 NOTE — Progress Notes (Signed)
Subjective:    Patient ID: Leslie Gomez, female    DOB: 09-08-1965, 47 y.o.   MRN: [id number]  HPI. Here for health maintenance exam and to review chronic medical problems    Feeling good  Nothing new going on   Wt is down 3 lb with bmi of 25 Is exercising and eating healthy   Flu vaccine - had it in oct   Mammogram 12/13- has one scheduled today in the afternoon  Self exam- no lumps or changes   Pap 12/13 nl  Menses- not too heavy and doing ok overall -no problems  Last about 4 days Does not need birth control   Td 9.07  Anemia chronic Lab Results  Component Value Date   WBC 4.8 01/20/2013   HGB 10.8* 01/20/2013   HCT 33.4* 01/20/2013   MCV 76* 01/20/2013   this is down from Hb of 11.1 Iron - she is not always complaint with this  Lifetime of anemia and it runs in family- does not want work up   Mood - has been good  Running in 5Ks also   She had recent surgery to remove excess breast tissue from axillae -it went very well     Chemistry      Component Value Date/Time   NA 141 01/20/2013 0919   K 4.3 01/20/2013 0919   CL 104 01/20/2013 0919   CO2 22 01/20/2013 0919   BUN 11 01/20/2013 0919   CREATININE 0.82 01/20/2013 0919      Component Value Date/Time   CALCIUM 9.0 01/20/2013 0919   ALKPHOS 61 01/20/2013 0919   AST 12 01/20/2013 0919   ALT 6 01/20/2013 0919   BILITOT 0.5 01/20/2013 0919     Lab Results  Component Value Date   TSH 1.730 01/20/2013    No results found for this basename: CHOL   Lab Results  Component Value Date   HDL 67 01/20/2013   HDL 74 40/10/8117   Lab Results  Component Value Date   LDLCALC 101* 01/20/2013   LDLCALC 88 01/22/2012   Lab Results  Component Value Date   TRIG 48 01/20/2013   TRIG 53 01/22/2012   Lab Results  Component Value Date   CHOLHDL 2.7 01/20/2013  lipitor and diet-no side effects  No results found for this basename: LDLDIRECT     BP Readings from Last 3 Encounters:  01/27/13 140/82  01/24/12 132/84    04/12/11 134/84     Patient Active Problem List   Diagnosis Date Noted  . Routine gynecological examination 01/17/2011  . GERD (gastroesophageal reflux disease) 01/17/2011  . Routine general medical examination at a health care facility 01/09/2011  . Other screening mammogram 01/04/2011  . HYPERCHOLESTEROLEMIA 11/19/2006  . ANEMIA-NOS 11/19/2006  . EXCESSIVE MENSTRUATION 11/19/2006   Past Medical History  Diagnosis Date  . Anemia     NOS since childhood runs in family  . Hyperlipidemia    Past Surgical History  Procedure Laterality Date  . Cesarean section     History  Substance Use Topics  . Smoking status: Never Smoker   . Smokeless tobacco: Not on file  . Alcohol Use: No   Family History  Problem Relation Age of Onset  . Hyperlipidemia Mother   . Hypertension Mother   . Anemia Mother   . Cancer Father     stomach CA   No Known Allergies Current Outpatient Prescriptions on File Prior to Visit  Medication Sig Dispense Refill  . atorvastatin (  LIPITOR) 10 MG tablet Take 1 tablet (10 mg total) by mouth daily.  90 tablet  3  . iron polysaccharides (NU-IRON) 150 MG capsule Take 1 capsule (150 mg total) by mouth 2 (two) times daily.  180 capsule  3   No current facility-administered medications on file prior to visit.    Review of Systems Review of Systems  Constitutional: Negative for fever, appetite change, fatigue and unexpected weight change.  Eyes: Negative for pain and visual disturbance.  Respiratory: Negative for cough and shortness of breath.   Cardiovascular: Negative for cp or palpitations    Gastrointestinal: Negative for nausea, diarrhea and constipation.  Genitourinary: Negative for urgency and frequency.  Skin: Negative for pallor or rash   Neurological: Negative for weakness, light-headedness, numbness and headaches.  Hematological: Negative for adenopathy. Does not bruise/bleed easily.  Psychiatric/Behavioral: Negative for dysphoric mood. The  patient is not nervous/anxious.         Objective:   Physical Exam  Constitutional: She appears well-developed and well-nourished. No distress.  HENT:  Head: Normocephalic and atraumatic.  Right Ear: External ear normal.  Left Ear: External ear normal.  Mouth/Throat: Oropharynx is clear and moist.  Eyes: Conjunctivae and EOM are normal. Pupils are equal, round, and reactive to light. No scleral icterus.  Neck: Normal range of motion. Neck supple. No JVD present. Carotid bruit is not present. No thyromegaly present.  Cardiovascular: Normal rate, regular rhythm, normal heart sounds and intact distal pulses.  Exam reveals no gallop.   Pulmonary/Chest: Effort normal and breath sounds normal. No respiratory distress. She has no wheezes. She exhibits no tenderness.  Abdominal: Soft. Bowel sounds are normal. She exhibits no distension, no abdominal bruit and no mass. There is no tenderness.  Genitourinary: Vagina normal and uterus normal. No breast swelling, tenderness, discharge or bleeding. There is no rash, tenderness or lesion on the right labia. There is no rash, tenderness or lesion on the left labia. Uterus is not enlarged and not tender. Cervix exhibits no motion tenderness, no discharge and no friability. Right adnexum displays no mass, no tenderness and no fullness. Left adnexum displays no mass, no tenderness and no fullness. No vaginal discharge found.  Breast exam: No mass, nodules, thickening, tenderness, bulging, retraction, inflamation, nipple discharge or skin changes noted.  No axillary or clavicular LA.  Chaperoned exam.    Musculoskeletal: Normal range of motion. She exhibits no edema and no tenderness.  Lymphadenopathy:    She has no cervical adenopathy.  Neurological: She is alert. She has normal reflexes. No cranial nerve deficit. She exhibits normal muscle tone. Coordination normal.  Skin: Skin is warm and dry. No rash noted. No erythema. No pallor.  Psychiatric: She has a  normal mood and affect.          Assessment & Plan:

## 2013-01-27 NOTE — Progress Notes (Signed)
Pre visit review using our clinic review tool, if applicable. No additional management support is needed unless otherwise documented below in the visit note. 

## 2013-01-27 NOTE — Assessment & Plan Note (Signed)
Chronic and worse after skipping nu iron  She will get back on this  No fatigue Continue to follow  Lifelong- desires no further w/u

## 2013-01-27 NOTE — Patient Instructions (Addendum)
Take care of yourself - I'm glad you are doing well  Keep exercising and eating right  Get back on your iron for anemia

## 2013-01-27 NOTE — Assessment & Plan Note (Signed)
Annual exam and pap  No problems

## 2013-01-28 ENCOUNTER — Encounter: Payer: Self-pay | Admitting: *Deleted

## 2013-01-31 ENCOUNTER — Encounter: Payer: Self-pay | Admitting: *Deleted

## 2013-11-12 ENCOUNTER — Other Ambulatory Visit: Payer: Self-pay

## 2013-11-12 DIAGNOSIS — Z1231 Encounter for screening mammogram for malignant neoplasm of breast: Secondary | ICD-10-CM

## 2014-03-09 ENCOUNTER — Ambulatory Visit
Admission: RE | Admit: 2014-03-09 | Discharge: 2014-03-09 | Disposition: A | Discharge status: Home / Self Care | Payer: Self-pay | Source: Ambulatory Visit

## 2014-03-09 DIAGNOSIS — Z1231 Encounter for screening mammogram for malignant neoplasm of breast: Secondary | ICD-10-CM

## 2014-03-09 LAB — HM MAMMOGRAPHY: HM Mammogram: NORMAL

## 2014-03-12 ENCOUNTER — Encounter: Payer: Self-pay | Admitting: *Deleted

## 2014-05-13 ENCOUNTER — Ambulatory Visit (INDEPENDENT_AMBULATORY_CARE_PROVIDER_SITE_OTHER): Payer: 59 | Admitting: Family Medicine

## 2014-05-13 ENCOUNTER — Other Ambulatory Visit (HOSPITAL_COMMUNITY)
Admission: RE | Admit: 2014-05-13 | Discharge: 2014-05-13 | Disposition: A | Discharge status: Home / Self Care | Payer: 59 | Source: Ambulatory Visit | Attending: Family Medicine | Admitting: Family Medicine

## 2014-05-13 ENCOUNTER — Encounter: Payer: Self-pay | Admitting: Family Medicine

## 2014-05-13 VITALS — BP 128/82 | HR 72 | Temp 98.1°F | Ht 66.25 in | Wt 158.0 lb

## 2014-05-13 DIAGNOSIS — Z114 Encounter for screening for human immunodeficiency virus [HIV]: Secondary | ICD-10-CM | POA: Insufficient documentation

## 2014-05-13 DIAGNOSIS — Z0000 Encounter for general adult medical examination without abnormal findings: Secondary | ICD-10-CM

## 2014-05-13 DIAGNOSIS — Z01419 Encounter for gynecological examination (general) (routine) without abnormal findings: Secondary | ICD-10-CM

## 2014-05-13 DIAGNOSIS — E7800 Pure hypercholesterolemia, unspecified: Secondary | ICD-10-CM

## 2014-05-13 MED ORDER — POLYSACCHARIDE IRON COMPLEX 150 MG PO CAPS: 150.0000 mg | ORAL_CAPSULE | Freq: Two times a day (BID) | ORAL | Status: DC

## 2014-05-13 MED ORDER — ATORVASTATIN CALCIUM 10 MG PO TABS: 10.0000 mg | ORAL_TABLET | Freq: Every day | ORAL | Status: DC

## 2014-05-13 NOTE — Progress Notes (Signed)
Subjective:    Patient ID: Leslie Gomez, female    DOB: 26-Nov-1965, 49 y.o.   MRN: [id number]  HPI Here for health maintenance exam and to review chronic medical problems    Has been doing well  She runs/ exercises  Taking good care of herself  Also eating healthy   Wt is down 1 lb with bmi of 25  HIV screen- wants to go ahead and do    Flu shot 10/15  Mammogram 1/16 nl  Self exam no breast lumps   Pap 12/14 nl  None since  Wants to do that today  No hx of abn pap  Still having menses irregularly - sporatic -last one was very light in March  No hot flashes  Still taking iron     Td 9/07  Hx of hyperlipidemia On lipitor -no problems  Diet - is healthy   Hx of anemia On nu iron   BP Readings from Last 3 Encounters:  05/13/14 140/82  01/27/13 130/80  01/24/12 132/84   BP: 128/82 mmHg on re check after sitting    Patient Active Problem List   Diagnosis Date Noted  . Screening for HIV (human immunodeficiency virus) 05/13/2014  . Encounter for routine gynecological examination 01/17/2011  . GERD (gastroesophageal reflux disease) 01/17/2011  . Routine general medical examination at a health care facility 01/09/2011  . Other screening mammogram 01/04/2011  . HYPERCHOLESTEROLEMIA 11/19/2006  . Anemia 11/19/2006  . EXCESSIVE MENSTRUATION 11/19/2006   Past Medical History  Diagnosis Date  . Anemia     NOS since childhood runs in family  . Hyperlipidemia    Past Surgical History  Procedure Laterality Date  . Cesarean section     History  Substance Use Topics  . Smoking status: Never Smoker   . Smokeless tobacco: Never Used  . Alcohol Use: No   Family History  Problem Relation Age of Onset  . Hyperlipidemia Mother   . Hypertension Mother   . Anemia Mother   . Cancer Father     stomach CA   No Known Allergies No current outpatient prescriptions on file prior to visit.   No current facility-administered medications on file prior to visit.     Review of Systems Review of Systems  Constitutional: Negative for fever, appetite change, fatigue and unexpected weight change.  Eyes: Negative for pain and visual disturbance.  Respiratory: Negative for cough and shortness of breath.   Cardiovascular: Negative for cp or palpitations    Gastrointestinal: Negative for nausea, diarrhea and constipation.  Genitourinary: Negative for urgency and frequency.  Skin: Negative for pallor or rash   Neurological: Negative for weakness, light-headedness, numbness and headaches.  Hematological: Negative for adenopathy. Does not bruise/bleed easily.  Psychiatric/Behavioral: Negative for dysphoric mood. The patient is not nervous/anxious.         Objective:   Physical Exam  Constitutional: She appears well-developed and well-nourished. No distress.  HENT:  Head: Normocephalic and atraumatic.  Right Ear: External ear normal.  Left Ear: External ear normal.  Nose: Nose normal.  Mouth/Throat: Oropharynx is clear and moist.  Eyes: Conjunctivae and EOM are normal. Pupils are equal, round, and reactive to light. Right eye exhibits no discharge. Left eye exhibits no discharge. No scleral icterus.  Neck: Normal range of motion. Neck supple. No JVD present. Carotid bruit is not present. No thyromegaly present.  Cardiovascular: Normal rate, regular rhythm, normal heart sounds and intact distal pulses.  Exam reveals no gallop.  Pulmonary/Chest: Effort normal and breath sounds normal. No respiratory distress. She has no wheezes. She has no rales.  Abdominal: Soft. Bowel sounds are normal. She exhibits no distension and no mass. There is no tenderness.  Genitourinary: There is no rash, tenderness or lesion on the right labia. There is no rash, tenderness or lesion on the left labia. Uterus is not enlarged and not tender. Cervix exhibits no motion tenderness, no discharge and no friability. Right adnexum displays no mass, no tenderness and no fullness. Left  adnexum displays no mass, no tenderness and no fullness. No erythema, tenderness or bleeding in the vagina. No vaginal discharge found.  Breast exam: No mass, nodules, thickening, tenderness, bulging, retraction, inflamation, nipple discharge or skin changes noted.  No axillary or clavicular LA.      Musculoskeletal: She exhibits no edema or tenderness.  Lymphadenopathy:    She has no cervical adenopathy.  Neurological: She is alert. She has normal reflexes. No cranial nerve deficit. She exhibits normal muscle tone. Coordination normal.  Skin: Skin is warm and dry. No rash noted. No erythema. No pallor.  Psychiatric: She has a normal mood and affect.          Assessment & Plan:   Problem List Items Addressed This Visit      Other   Encounter for routine gynecological examination    Exam with pap today  No complaints  Pt states she does not need contraception  Perimenopausal with irregular menses       HYPERCHOLESTEROLEMIA   Relevant Medications   atorvastatin (LIPITOR) tablet   Routine general medical examination at a health care facility - Primary    Reviewed health habits including diet and exercise and skin cancer prevention Reviewed appropriate screening tests for age  Also reviewed health mt list, fam hx and immunization status , as well as social and family history   See HPI Lab today      Relevant Orders   Comprehensive metabolic panel   CBC with Differential/Platelet   TSH   Lipid panel   Screening for HIV (human immunodeficiency virus)    HIV screening to labs Not high risk       Relevant Orders   HIV antibody (with reflex)

## 2014-05-13 NOTE — Progress Notes (Signed)
Pre visit review using our clinic review tool, if applicable. No additional management support is needed unless otherwise documented below in the visit note. 

## 2014-05-13 NOTE — Patient Instructions (Addendum)
Pap today  Labs today  Keep up the good health habits

## 2014-05-14 ENCOUNTER — Other Ambulatory Visit: Payer: Self-pay

## 2014-05-14 DIAGNOSIS — Z1231 Encounter for screening mammogram for malignant neoplasm of breast: Secondary | ICD-10-CM

## 2014-05-14 NOTE — Assessment & Plan Note (Signed)
HIV screening to labs Not high risk

## 2014-05-14 NOTE — Assessment & Plan Note (Signed)
Exam with pap today  No complaints  Pt states she does not need contraception  Perimenopausal with irregular menses

## 2014-05-14 NOTE — Assessment & Plan Note (Signed)
Reviewed health habits including diet and exercise and skin cancer prevention Reviewed appropriate screening tests for age  Also reviewed health mt list, fam hx and immunization status , as well as social and family history   See HPI Lab today

## 2014-05-15 LAB — CBC WITH DIFFERENTIAL/PLATELET
Basophils Absolute: 0 10*3/uL (ref 0.0–0.2)
Basos: 1 %
Eos: 2 %
Eosinophils Absolute: 0.1 10*3/uL (ref 0.0–0.4)
HCT: 30.9 % — ABNORMAL LOW (ref 34.0–46.6)
Hemoglobin: 9.5 g/dL — ABNORMAL LOW (ref 11.1–15.9)
IMMATURE GRANS (ABS): 0 10*3/uL (ref 0.0–0.1)
Immature Granulocytes: 0 %
Lymphocytes Absolute: 1.9 10*3/uL (ref 0.7–3.1)
Lymphs: 40 %
MCH: 22.4 pg — AB (ref 26.6–33.0)
MCHC: 30.7 g/dL — AB (ref 31.5–35.7)
MCV: 73 fL — ABNORMAL LOW (ref 79–97)
MONOS ABS: 0.4 10*3/uL (ref 0.1–0.9)
Monocytes: 9 %
NEUTROS PCT: 48 %
Neutrophils Absolute: 2.3 10*3/uL (ref 1.4–7.0)
PLATELETS: 214 10*3/uL (ref 150–379)
RBC: 4.24 x10E6/uL (ref 3.77–5.28)
RDW: 16.5 % — AB (ref 12.3–15.4)
WBC: 4.7 10*3/uL (ref 3.4–10.8)

## 2014-05-15 LAB — LIPID PANEL
Chol/HDL Ratio: 2 ratio units (ref 0.0–4.4)
Cholesterol, Total: 165 mg/dL (ref 100–199)
HDL: 81 mg/dL (ref >39)
LDL Calculated: 58 mg/dL (ref 0–99)
Triglycerides: 128 mg/dL (ref 0–149)
VLDL Cholesterol Cal: 26 mg/dL (ref 5–40)

## 2014-05-15 LAB — COMPREHENSIVE METABOLIC PANEL
A/G RATIO: 2 (ref 1.1–2.5)
ALT: 9 IU/L (ref 0–32)
AST: 18 IU/L (ref 0–40)
Albumin: 4.9 g/dL (ref 3.5–5.5)
Alkaline Phosphatase: 59 IU/L (ref 39–117)
BILIRUBIN TOTAL: 0.5 mg/dL (ref 0.0–1.2)
BUN/Creatinine Ratio: 16 (ref 9–23)
BUN: 14 mg/dL (ref 6–24)
CO2: 25 mmol/L (ref 18–29)
Calcium: 9.6 mg/dL (ref 8.7–10.2)
Chloride: 100 mmol/L (ref 97–108)
Creatinine, Ser: 0.89 mg/dL (ref 0.57–1.00)
GFR calc non Af Amer: 76 mL/min/{1.73_m2} (ref >59)
GFR, EST AFRICAN AMERICAN: 88 mL/min/{1.73_m2} (ref >59)
GLOBULIN, TOTAL: 2.5 g/dL (ref 1.5–4.5)
GLUCOSE: 75 mg/dL (ref 65–99)
Potassium: 4.3 mmol/L (ref 3.5–5.2)
SODIUM: 142 mmol/L (ref 134–144)
Total Protein: 7.4 g/dL (ref 6.0–8.5)

## 2014-05-15 LAB — TSH: TSH: 1.71 u[IU]/mL (ref 0.450–4.500)

## 2014-05-15 LAB — CYTOLOGY - PAP

## 2014-05-15 LAB — HIV ANTIBODY (ROUTINE TESTING W REFLEX): HIV SCREEN 4TH GENERATION: NONREACTIVE

## 2014-05-18 ENCOUNTER — Telehealth: Payer: Self-pay

## 2014-05-18 NOTE — Telephone Encounter (Signed)
-----   Message from Abner Greenspan, MD sent at 05/17/2014 10:46 PM EDT ----- Anemia is worse- suspect from periods  Other labs ok  Please ask if she is still taking iron and if she is missing any doses  If no missed doses-continue it and re check cbc with diff and ferritin again in 1 mo for dx of iron def anemia  Thanks

## 2014-05-18 NOTE — Telephone Encounter (Signed)
Patient aware of lab results.  She has missed some Iron doses, so she will work on taking that more regular.

## 2015-05-09 ENCOUNTER — Telehealth: Payer: Self-pay | Admitting: Family Medicine

## 2015-05-09 DIAGNOSIS — Z0000 Encounter for general adult medical examination without abnormal findings: Secondary | ICD-10-CM

## 2015-05-09 NOTE — Telephone Encounter (Signed)
-----   Message from Ellamae Sia sent at 05/05/2015 11:20 AM EDT ----- Regarding: Lab orders for Wednesday, 3.22.17 Patient is scheduled for CPX labs, please order future labs, Thanks , Karna Christmas

## 2015-05-12 ENCOUNTER — Other Ambulatory Visit (INDEPENDENT_AMBULATORY_CARE_PROVIDER_SITE_OTHER): Payer: 59

## 2015-05-12 DIAGNOSIS — Z0000 Encounter for general adult medical examination without abnormal findings: Secondary | ICD-10-CM | POA: Diagnosis not present

## 2015-05-13 ENCOUNTER — Other Ambulatory Visit: Payer: 59

## 2015-05-14 LAB — CBC WITH DIFFERENTIAL/PLATELET
BASOS: 0 %
Basophils Absolute: 0 10*3/uL (ref 0.0–0.2)
EOS (ABSOLUTE): 0 10*3/uL (ref 0.0–0.4)
EOS: 1 %
HEMATOCRIT: 35.2 % (ref 34.0–46.6)
HEMOGLOBIN: 11.2 g/dL (ref 11.1–15.9)
Immature Grans (Abs): 0 10*3/uL (ref 0.0–0.1)
Immature Granulocytes: 0 %
LYMPHS ABS: 1.4 10*3/uL (ref 0.7–3.1)
Lymphs: 38 %
MCH: 24.1 pg — AB (ref 26.6–33.0)
MCHC: 31.8 g/dL (ref 31.5–35.7)
MCV: 76 fL — AB (ref 79–97)
MONOCYTES: 7 %
MONOS ABS: 0.3 10*3/uL (ref 0.1–0.9)
NEUTROS ABS: 2.1 10*3/uL (ref 1.4–7.0)
Neutrophils: 54 %
Platelets: 223 10*3/uL (ref 150–379)
RBC: 4.65 x10E6/uL (ref 3.77–5.28)
RDW: 16.6 % — ABNORMAL HIGH (ref 12.3–15.4)
WBC: 3.8 10*3/uL (ref 3.4–10.8)

## 2015-05-14 LAB — COMPREHENSIVE METABOLIC PANEL
ALK PHOS: 49 IU/L (ref 39–117)
ALT: 11 IU/L (ref 0–32)
AST: 20 IU/L (ref 0–40)
Albumin/Globulin Ratio: 1.4 (ref 1.2–2.2)
Albumin: 4.4 g/dL (ref 3.5–5.5)
BUN/Creatinine Ratio: 15 (ref 9–23)
BUN: 11 mg/dL (ref 6–24)
Bilirubin Total: 0.4 mg/dL (ref 0.0–1.2)
CO2: 20 mmol/L (ref 18–29)
Calcium: 9.3 mg/dL (ref 8.7–10.2)
Chloride: 99 mmol/L (ref 96–106)
Creatinine, Ser: 0.75 mg/dL (ref 0.57–1.00)
GFR calc Af Amer: 107 mL/min/{1.73_m2} (ref >59)
GFR calc non Af Amer: 93 mL/min/{1.73_m2} (ref >59)
GLUCOSE: 95 mg/dL (ref 65–99)
Globulin, Total: 3.1 g/dL (ref 1.5–4.5)
Potassium: 4 mmol/L (ref 3.5–5.2)
SODIUM: 139 mmol/L (ref 134–144)
Total Protein: 7.5 g/dL (ref 6.0–8.5)

## 2015-05-14 LAB — LIPID PANEL
CHOL/HDL RATIO: 2.6 ratio (ref 0.0–4.4)
Cholesterol, Total: 224 mg/dL — ABNORMAL HIGH (ref 100–199)
HDL: 85 mg/dL (ref >39)
LDL CALC: 126 mg/dL — AB (ref 0–99)
Triglycerides: 67 mg/dL (ref 0–149)
VLDL CHOLESTEROL CAL: 13 mg/dL (ref 5–40)

## 2015-05-14 LAB — TSH: TSH: 1.18 u[IU]/mL (ref 0.450–4.500)

## 2015-05-16 LAB — HM MAMMOGRAPHY: HM Mammogram: NORMAL

## 2015-05-18 ENCOUNTER — Encounter: Payer: Self-pay | Admitting: Family Medicine

## 2015-05-18 ENCOUNTER — Ambulatory Visit
Admission: RE | Admit: 2015-05-18 | Discharge: 2015-05-18 | Disposition: A | Discharge status: Home / Self Care | Payer: 59 | Source: Ambulatory Visit

## 2015-05-18 ENCOUNTER — Ambulatory Visit (INDEPENDENT_AMBULATORY_CARE_PROVIDER_SITE_OTHER): Payer: 59 | Admitting: Family Medicine

## 2015-05-18 ENCOUNTER — Encounter: Payer: Self-pay | Admitting: Gastroenterology

## 2015-05-18 VITALS — BP 135/80 | HR 52 | Temp 99.0°F | Ht 66.5 in | Wt 159.2 lb

## 2015-05-18 DIAGNOSIS — Z0000 Encounter for general adult medical examination without abnormal findings: Secondary | ICD-10-CM

## 2015-05-18 DIAGNOSIS — Z23 Encounter for immunization: Secondary | ICD-10-CM

## 2015-05-18 DIAGNOSIS — Z1231 Encounter for screening mammogram for malignant neoplasm of breast: Secondary | ICD-10-CM

## 2015-05-18 DIAGNOSIS — D500 Iron deficiency anemia secondary to blood loss (chronic): Secondary | ICD-10-CM

## 2015-05-18 DIAGNOSIS — Z1211 Encounter for screening for malignant neoplasm of colon: Secondary | ICD-10-CM | POA: Insufficient documentation

## 2015-05-18 DIAGNOSIS — E7800 Pure hypercholesterolemia, unspecified: Secondary | ICD-10-CM

## 2015-05-18 MED ORDER — FERROUS SULFATE 325 (65 FE) MG PO TABS: 325.0000 mg | ORAL_TABLET | Freq: Two times a day (BID) | ORAL | Status: DC

## 2015-05-18 MED ORDER — ATORVASTATIN CALCIUM 10 MG PO TABS: 5.0000 mg | ORAL_TABLET | Freq: Every day | ORAL | Status: DC

## 2015-05-18 NOTE — Assessment & Plan Note (Signed)
Pt stopped atorvastatin due to loosing px  Rev lipid prof Agrees to go back on 1/2 pill (5 mg) daily  Great diet and exercise Continue to follow

## 2015-05-18 NOTE — Progress Notes (Signed)
Pre visit review using our clinic review tool, if applicable. No additional management support is needed unless otherwise documented below in the visit note. 

## 2015-05-18 NOTE — Progress Notes (Signed)
Subjective:    Patient ID: Leslie Gomez, female    DOB: [birth year], 50 y.o.   MRN: [id number]  HPI Here for health maintenance exam and to review chronic medical problems    Going through a divorce now - for the best   Doing well overall   Wt is up 1 lb with bmi of 25  HIV screen 3/16 neg  Flu shot - had it in Oct   Colonoscopy  No family history of colon ca  Is ready to do a colonoscopy   Td 9/07 - 10 years - will go ahead and do that   Pap 3/16 neg Last 3 negative  No hx of HPV  Menses - had one in oct and then jan  This summer - was every month  Thinks she is winding down to menopause  occ hot flash or night sweat    Mammogram 1/16 - has it planned for today  No lumps on self exam   Anemia Hb is up to 11.2  (from 9.5) She is is taking otc iron tolerating it fine  Also having less period  Cholesterol Lab Results  Component Value Date   CHOL 224* 05/12/2015   CHOL 165 05/13/2014   CHOL 178 01/20/2013   Lab Results  Component Value Date   HDL 85 05/12/2015   HDL 81 05/13/2014   HDL 67 01/20/2013   Lab Results  Component Value Date   LDLCALC 126* 05/12/2015   LDLCALC 58 05/13/2014   LDLCALC 101* 01/20/2013   Lab Results  Component Value Date   TRIG 67 05/12/2015   TRIG 128 05/13/2014   TRIG 48 01/20/2013   Lab Results  Component Value Date   CHOLHDL 2.6 05/12/2015   CHOLHDL 2.0 05/13/2014   CHOLHDL 2.7 01/20/2013  is off her cholesterol med  Lost her px  Then tried to watch diet - and exercising  She would like to go back on chol med- 5 a day instead of 10   Wt is up 1 lb with bmi of 25  BP was up today-on first check  White coat syndrome  BP Readings from Last 3 Encounters:  05/18/15 152/82  05/13/14 128/82  01/27/13 130/80   re check 135/80  Patient Active Problem List   Diagnosis Date Noted  . Colon cancer screening 05/18/2015  . Screening for HIV (human immunodeficiency virus) 05/13/2014  . Encounter for routine  gynecological examination 01/17/2011  . GERD (gastroesophageal reflux disease) 01/17/2011  . Routine general medical examination at a health care facility 01/09/2011  . Other screening mammogram 01/04/2011  . HYPERCHOLESTEROLEMIA 11/19/2006  . Anemia 11/19/2006  . EXCESSIVE MENSTRUATION 11/19/2006   Past Medical History  Diagnosis Date  . Anemia     NOS since childhood runs in family  . Hyperlipidemia    Past Surgical History  Procedure Laterality Date  . Cesarean section     Social History  Substance Use Topics  . Smoking status: Never Smoker   . Smokeless tobacco: Never Used  . Alcohol Use: No   Family History  Problem Relation Age of Onset  . Hyperlipidemia Mother   . Hypertension Mother   . Anemia Mother   . Cancer Father     stomach CA   No Known Allergies No current outpatient prescriptions on file prior to visit.   No current facility-administered medications on file prior to visit.    Review of Systems Review of Systems  Constitutional: Negative for fever, appetite  change, fatigue and unexpected weight change.  Eyes: Negative for pain and visual disturbance.  Respiratory: Negative for cough and shortness of breath.   Cardiovascular: Negative for cp or palpitations    Gastrointestinal: Negative for nausea, diarrhea and constipation.  Genitourinary: Negative for urgency and frequency.  Skin: Negative for pallor or rash   Neurological: Negative for weakness, light-headedness, numbness and headaches.  Hematological: Negative for adenopathy. Does not bruise/bleed easily.  Psychiatric/Behavioral: Negative for dysphoric mood. The patient is not nervous/anxious.         Objective:   Physical Exam  Constitutional: She appears well-developed and well-nourished. No distress.  Well appearing   HENT:  Head: Normocephalic and atraumatic.  Right Ear: External ear normal.  Left Ear: External ear normal.  Mouth/Throat: Oropharynx is clear and moist.  Eyes:  Conjunctivae and EOM are normal. Pupils are equal, round, and reactive to light. No scleral icterus.  Neck: Normal range of motion. Neck supple. No JVD present. Carotid bruit is not present. No thyromegaly present.  Cardiovascular: Normal rate, regular rhythm, normal heart sounds and intact distal pulses.  Exam reveals no gallop.   Pulmonary/Chest: Effort normal and breath sounds normal. No respiratory distress. She has no wheezes. She exhibits no tenderness.  Abdominal: Soft. Bowel sounds are normal. She exhibits no distension, no abdominal bruit and no mass. There is no tenderness.  Genitourinary: No breast swelling, tenderness, discharge or bleeding.  Breast exam: No mass, nodules, thickening, tenderness, bulging, retraction, inflamation, nipple discharge or skin changes noted.  No axillary or clavicular LA.      Musculoskeletal: Normal range of motion. She exhibits no edema or tenderness.  Lymphadenopathy:    She has no cervical adenopathy.  Neurological: She is alert. She has normal reflexes. No cranial nerve deficit. She exhibits normal muscle tone. Coordination normal.  Skin: Skin is warm and dry. No rash noted. No erythema. No pallor.  Psychiatric: She has a normal mood and affect.          Assessment & Plan:   Problem List Items Addressed This Visit      Other   Anemia    Improved with less menses (perimenopause) and also ferrous sulfate 325 bid (tolerates) Continue to follow Hb 11.2 today      Relevant Medications   ferrous sulfate 325 (65 FE) MG tablet   Colon cancer screening   Relevant Orders   Ambulatory referral to Gastroenterology   HYPERCHOLESTEROLEMIA    Pt stopped atorvastatin due to loosing px  Rev lipid prof Agrees to go back on 1/2 pill (5 mg) daily  Great diet and exercise Continue to follow       Relevant Medications   atorvastatin (LIPITOR) 10 MG tablet   Routine general medical examination at a health care facility - Primary    Reviewed health  habits including diet and exercise and skin cancer prevention Reviewed appropriate screening tests for age  Also reviewed health mt list, fam hx and immunization status , as well as social and family history   See HPI Labs rev  Anemia is improved - less menses/on iron  Chol up off lipitor - will re start 1/2 dose  Mammogram today  Last 3 pap nl - will skip pap today Commended on good habits         Other Visit Diagnoses    Need for Tdap vaccination        Relevant Orders    Tdap vaccine greater than or equal to 7yo IM (  Completed)

## 2015-05-18 NOTE — Assessment & Plan Note (Signed)
Reviewed health habits including diet and exercise and skin cancer prevention Reviewed appropriate screening tests for age  Also reviewed health mt list, fam hx and immunization status , as well as social and family history   See HPI Labs rev  Anemia is improved - less menses/on iron  Chol up off lipitor - will re start 1/2 dose  Mammogram today  Last 3 pap nl - will skip pap today Commended on good habits

## 2015-05-18 NOTE — Patient Instructions (Addendum)
Take 1/2 of the lipitor daily  Continue great diet and exercise We will continue to watch your blood pressure Stop at check out for colonoscopy referral  Tdap vaccine today  Continue iron

## 2015-05-18 NOTE — Assessment & Plan Note (Signed)
Improved with less menses (perimenopause) and also ferrous sulfate 325 bid (tolerates) Continue to follow Hb 11.2 today

## 2015-05-19 ENCOUNTER — Encounter: Payer: Self-pay | Admitting: *Deleted

## 2015-06-29 ENCOUNTER — Ambulatory Visit (AMBULATORY_SURGERY_CENTER): Payer: Self-pay

## 2015-06-29 VITALS — Ht 66.5 in | Wt 161.2 lb

## 2015-06-29 DIAGNOSIS — Z1211 Encounter for screening for malignant neoplasm of colon: Secondary | ICD-10-CM

## 2015-06-29 MED ORDER — SUPREP BOWEL PREP KIT 17.5-3.13-1.6 GM/177ML PO SOLN: 1.0000 | Freq: Once | ORAL | Status: DC

## 2015-06-29 NOTE — Progress Notes (Signed)
No allergies to eggs or soy No diet meds No home oxygen No past problem with anesthesia  Has email and internet; registered for emmi

## 2015-06-30 ENCOUNTER — Encounter: Payer: Self-pay | Admitting: Gastroenterology

## 2015-07-09 ENCOUNTER — Encounter: Payer: Self-pay | Admitting: Gastroenterology

## 2015-07-09 ENCOUNTER — Ambulatory Visit (AMBULATORY_SURGERY_CENTER): Payer: 59 | Admitting: Gastroenterology

## 2015-07-09 VITALS — BP 145/69 | HR 44 | Temp 98.6°F | Resp 13 | Ht 66.5 in | Wt 165.0 lb

## 2015-07-09 DIAGNOSIS — Z1211 Encounter for screening for malignant neoplasm of colon: Secondary | ICD-10-CM

## 2015-07-09 DIAGNOSIS — D122 Benign neoplasm of ascending colon: Secondary | ICD-10-CM

## 2015-07-09 DIAGNOSIS — D129 Benign neoplasm of anus and anal canal: Secondary | ICD-10-CM

## 2015-07-09 DIAGNOSIS — D128 Benign neoplasm of rectum: Secondary | ICD-10-CM

## 2015-07-09 MED ORDER — SODIUM CHLORIDE 0.9 % IV SOLN: 500.0000 mL | INTRAVENOUS | Status: DC

## 2015-07-09 NOTE — Progress Notes (Signed)
Called to room to assist during endoscopic procedure.  Patient ID and intended procedure confirmed with present staff. Received instructions for my participation in the procedure from the performing physician.  

## 2015-07-09 NOTE — Op Note (Signed)
[place] Patient Name: Leslie Gomez Procedure Date: 07/09/2015 7:19 AM MRN: [id number] Endoscopist: Ladene Artist , MD Age: 50 Referring MD:  Date of Birth: 01-Jun-1965 Gender: Female Procedure:                Colonoscopy Indications:              Screening for colorectal malignant neoplasm Medicines:                Monitored Anesthesia Care Procedure:                Pre-Anesthesia Assessment:                           - Prior to the procedure, a History and Physical                            was performed, and patient medications and                            allergies were reviewed. The patient's tolerance of                            previous anesthesia was also reviewed. The risks                            and benefits of the procedure and the sedation                            options and risks were discussed with the patient.                            All questions were answered, and informed consent                            was obtained. Prior Anticoagulants: The patient has                            taken no previous anticoagulant or antiplatelet                            agents. ASA Grade Assessment: II - A patient with                            mild systemic disease. After reviewing the risks                            and benefits, the patient was deemed in                            satisfactory condition to undergo the procedure.                           After obtaining informed consent, the colonoscope  was passed under direct vision. Throughout the                            procedure, the patient's blood pressure, pulse, and                            oxygen saturations were monitored continuously. The                            Model PCF-H190DL [id number]) scope was introduced                            through the anus and advanced to the the cecum,                            identified by appendiceal orifice and  ileocecal                            valve. The colonoscopy was performed without                            difficulty. The patient tolerated the procedure                            well. The quality of the bowel preparation was                            adequate. The ileocecal valve, appendiceal orifice,                            and rectum were photographed. Scope In: 8:07:34 AM Scope Out: 8:24:04 AM Scope Withdrawal Time: 0 hours 13 minutes 38 seconds  Total Procedure Duration: 0 hours 16 minutes 30 seconds  Findings:                 The digital rectal exam was normal.                           Two sessile polyps were found in the rectum and                            ascending colon. The polyps were 4 mm in size.                            These polyps were removed with a cold biopsy                            forceps. Resection and retrieval were complete.                           A few small-mouthed diverticula were found in the                            sigmoid colon. There was no evidence of  diverticular bleeding.                           The exam was otherwise normal throughout the                            examined colon.                           The retroflexed view of the distal rectum and anal                            verge was normal and showed no anal or rectal                            abnormalities. Complications:            No immediate complications. Estimated Blood Loss:     Estimated blood loss: none. Impression:               - Two 4 mm polyps in the rectum and in the                            ascending colon, removed with a cold biopsy                            forceps. Resected and retrieved.                           - Mild diverticulosis in the sigmoid colon. Recommendation:           - Patient has a contact number available for                            emergencies. The signs and symptoms of potential                             delayed complications were discussed with the                            patient. Return to normal activities tomorrow.                            Written discharge instructions were provided to the                            patient.                           - High fiber diet indefinitely.                           - Continue present medications.                           - Await pathology results.                           -  Repeat colonoscopy in 5 years for surveillance if                            polyp(s) are precancerous, otherwise 10 years for                            screening. Ladene Artist, MD 07/09/2015 8:28:18 AM This report has been signed electronically.

## 2015-07-09 NOTE — Progress Notes (Signed)
Report to PACU, RN, vss, BBS= Clear.  

## 2015-07-09 NOTE — Patient Instructions (Signed)
YOU HAD AN ENDOSCOPIC PROCEDURE TODAY AT [facility]:   Refer to the procedure report that was given to you for any specific questions about what was found during the examination.  If the procedure report does not answer your questions, please call your gastroenterologist to clarify.  If you requested that your care partner not be given the details of your procedure findings, then the procedure report has been included in a sealed envelope for you to review at your convenience later.  YOU SHOULD EXPECT: Some feelings of bloating in the abdomen. Passage of more gas than usual.  Walking can help get rid of the air that was put into your GI tract during the procedure and reduce the bloating. If you had a lower endoscopy (such as a colonoscopy or flexible sigmoidoscopy) you may notice spotting of blood in your stool or on the toilet paper. If you underwent a bowel prep for your procedure, you may not have a normal bowel movement for a few days.  Please Note:  You might notice some irritation and congestion in your nose or some drainage.  This is from the oxygen used during your procedure.  There is no need for concern and it should clear up in a day or so.  SYMPTOMS TO REPORT IMMEDIATELY:   Following lower endoscopy (colonoscopy or flexible sigmoidoscopy):  Excessive amounts of blood in the stool  Significant tenderness or worsening of abdominal pains  Swelling of the abdomen that is new, acute  Fever of 100F or higher  For urgent or emergent issues, a gastroenterologist can be reached at any hour by calling [phone].  DIET: Your first meal following the procedure should be a small meal and then it is ok to progress to your normal diet. Heavy or fried foods are harder to digest and may make you feel nauseous or bloated.  Likewise, meals heavy in dairy and vegetables can increase bloating.  Drink plenty of fluids but you should avoid alcoholic beverages for 24 hours.  ACTIVITY:   You should plan to take it easy for the rest of today and you should NOT DRIVE or use heavy machinery until tomorrow (because of the sedation medicines used during the test).    FOLLOW UP: Our staff will call the number listed on your records the next business day following your procedure to check on you and address any questions or concerns that you may have regarding the information given to you following your procedure. If we do not reach you, we will leave a message.  However, if you are feeling well and you are not experiencing any problems, there is no need to return our call.  We will assume that you have returned to your regular daily activities without incident.  If any biopsies were taken you will be contacted by phone or by letter within the next 1-3 weeks.  Please call us at [phone] if you have not heard about the biopsies in 3 weeks.   SIGNATURES/CONFIDENTIALITY: You and/or your care partner have signed paperwork which will be entered into your electronic medical record.  These signatures attest to the fact that that the information above on your After Visit Summary has been reviewed and is understood.  Full responsibility of the confidentiality of this discharge information lies with you and/or your care-partner  Please read over handouts about polyps, diverticulosis and high fiber diets  Continue your normal medications

## 2015-07-12 ENCOUNTER — Telehealth: Payer: Self-pay

## 2015-07-12 NOTE — Telephone Encounter (Signed)
  Follow up Call-  Call back number 07/09/2015  Post procedure Call Back phone  # [phone]  Permission to leave phone message Yes     Patient questions:  Do you have a fever, pain , or abdominal swelling? No. Pain Score  0 *  Have you tolerated food without any problems? Yes.    Have you been able to return to your normal activities? Yes.    Do you have any questions about your discharge instructions: Diet   No. Medications  No. Follow up visit  No.  Do you have questions or concerns about your Care? No.  Actions: * If pain score is 4 or above: No action needed, pain <4.

## 2015-07-13 ENCOUNTER — Encounter: Payer: Self-pay | Admitting: Gastroenterology

## 2016-02-21 HISTORY — PX: ABDOMINOPLASTY: SUR9

## 2016-03-06 ENCOUNTER — Telehealth: Payer: Self-pay | Admitting: Family Medicine

## 2016-03-06 ENCOUNTER — Other Ambulatory Visit: Payer: Self-pay | Admitting: Family Medicine

## 2016-03-06 DIAGNOSIS — Z1231 Encounter for screening mammogram for malignant neoplasm of breast: Secondary | ICD-10-CM

## 2016-03-06 DIAGNOSIS — Z0000 Encounter for general adult medical examination without abnormal findings: Secondary | ICD-10-CM

## 2016-03-06 NOTE — Telephone Encounter (Signed)
Pt would like labs done at [facility] sent to her for her 07/03 cpe. She works there  cb number is [phone].

## 2016-03-07 NOTE — Telephone Encounter (Signed)
I ordered a future [facility] draw for lab/ not clinic  Please release and send to pt if needed Thanks

## 2016-03-10 NOTE — Telephone Encounter (Signed)
Leslie Gomez released labs orders and a copy of the orders were mailed to pt

## 2016-05-18 ENCOUNTER — Ambulatory Visit
Admission: RE | Admit: 2016-05-18 | Discharge: 2016-05-18 | Disposition: A | Discharge status: Home / Self Care | Payer: 59 | Source: Ambulatory Visit | Attending: Family Medicine | Admitting: Family Medicine

## 2016-05-18 DIAGNOSIS — Z1231 Encounter for screening mammogram for malignant neoplasm of breast: Secondary | ICD-10-CM | POA: Diagnosis not present

## 2016-05-23 ENCOUNTER — Encounter: Payer: Self-pay | Admitting: *Deleted

## 2016-06-11 ENCOUNTER — Other Ambulatory Visit: Payer: Self-pay | Admitting: Family Medicine

## 2016-08-16 DIAGNOSIS — Z0000 Encounter for general adult medical examination without abnormal findings: Secondary | ICD-10-CM | POA: Diagnosis not present

## 2016-08-17 LAB — LIPID PANEL
CHOLESTEROL TOTAL: 166 mg/dL (ref 100–199)
Chol/HDL Ratio: 2 ratio (ref 0.0–4.4)
HDL: 85 mg/dL (ref >39)
LDL Calculated: 67 mg/dL (ref 0–99)
Triglycerides: 68 mg/dL (ref 0–149)
VLDL CHOLESTEROL CAL: 14 mg/dL (ref 5–40)

## 2016-08-17 LAB — COMPREHENSIVE METABOLIC PANEL
A/G RATIO: 1.8 (ref 1.2–2.2)
ALBUMIN: 4.8 g/dL (ref 3.5–5.5)
ALK PHOS: 58 IU/L (ref 39–117)
ALT: 9 IU/L (ref 0–32)
AST: 18 IU/L (ref 0–40)
BILIRUBIN TOTAL: 0.8 mg/dL (ref 0.0–1.2)
BUN / CREAT RATIO: 12 (ref 9–23)
BUN: 10 mg/dL (ref 6–24)
CHLORIDE: 102 mmol/L (ref 96–106)
CO2: 24 mmol/L (ref 20–29)
Calcium: 9.5 mg/dL (ref 8.7–10.2)
Creatinine, Ser: 0.86 mg/dL (ref 0.57–1.00)
GFR calc non Af Amer: 78 mL/min/{1.73_m2} (ref >59)
GFR, EST AFRICAN AMERICAN: 90 mL/min/{1.73_m2} (ref >59)
GLUCOSE: 96 mg/dL (ref 65–99)
Globulin, Total: 2.7 g/dL (ref 1.5–4.5)
POTASSIUM: 4.1 mmol/L (ref 3.5–5.2)
Sodium: 141 mmol/L (ref 134–144)
Total Protein: 7.5 g/dL (ref 6.0–8.5)

## 2016-08-17 LAB — CBC WITH DIFFERENTIAL/PLATELET
BASOS ABS: 0 10*3/uL (ref 0.0–0.2)
BASOS: 0 %
EOS (ABSOLUTE): 0.1 10*3/uL (ref 0.0–0.4)
Eos: 1 %
Hematocrit: 35.3 % (ref 34.0–46.6)
Hemoglobin: 11.2 g/dL (ref 11.1–15.9)
Immature Grans (Abs): 0 10*3/uL (ref 0.0–0.1)
Immature Granulocytes: 0 %
LYMPHS ABS: 1.8 10*3/uL (ref 0.7–3.1)
Lymphs: 39 %
MCH: 24.4 pg — ABNORMAL LOW (ref 26.6–33.0)
MCHC: 31.7 g/dL (ref 31.5–35.7)
MCV: 77 fL — AB (ref 79–97)
MONOS ABS: 0.3 10*3/uL (ref 0.1–0.9)
Monocytes: 5 %
NEUTROS ABS: 2.5 10*3/uL (ref 1.4–7.0)
Neutrophils: 55 %
PLATELETS: 222 10*3/uL (ref 150–379)
RBC: 4.59 x10E6/uL (ref 3.77–5.28)
RDW: 15.4 % (ref 12.3–15.4)
WBC: 4.6 10*3/uL (ref 3.4–10.8)

## 2016-08-17 LAB — TSH: TSH: 1.25 u[IU]/mL (ref 0.450–4.500)

## 2016-08-22 ENCOUNTER — Encounter: Payer: Self-pay | Admitting: Family Medicine

## 2016-08-22 ENCOUNTER — Ambulatory Visit (INDEPENDENT_AMBULATORY_CARE_PROVIDER_SITE_OTHER): Payer: 59 | Admitting: Family Medicine

## 2016-08-22 VITALS — BP 126/86 | HR 58 | Temp 98.6°F | Ht 66.5 in | Wt 156.8 lb

## 2016-08-22 DIAGNOSIS — D509 Iron deficiency anemia, unspecified: Secondary | ICD-10-CM

## 2016-08-22 DIAGNOSIS — E7800 Pure hypercholesterolemia, unspecified: Secondary | ICD-10-CM

## 2016-08-22 DIAGNOSIS — Z0000 Encounter for general adult medical examination without abnormal findings: Secondary | ICD-10-CM | POA: Diagnosis not present

## 2016-08-22 MED ORDER — ATORVASTATIN CALCIUM 10 MG PO TABS: 5.0000 mg | ORAL_TABLET | Freq: Every day | ORAL | 3 refills | Status: DC

## 2016-08-22 NOTE — Assessment & Plan Note (Signed)
Reviewed health habits including diet and exercise and skin cancer prevention Reviewed appropriate screening tests for age  Also reviewed health mt list, fam hx and immunization status , as well as social and family history   See HPI Labs reviewed Stable chronic anemia  Peri menopausal- antic guidance  Will do pap next time  Disc imp of ca and D for bone health

## 2016-08-22 NOTE — Progress Notes (Signed)
Subjective:    Patient ID: Leslie Gomez, female    DOB: 12/03/1965, 51 y.o.   MRN: [id number]  HPI Here for health maintenance exam and to review chronic medical problems    Doing well  No c/o  Divorce was finalized  She also had a tummy tuck surgery with lipo    Wt Readings from Last 3 Encounters:  08/22/16 156 lb 12 oz (71.1 kg)  07/09/15 165 lb (74.8 kg)  06/29/15 161 lb 3.2 oz (73.1 kg)  she has been exercising 5 days per week  Eating healthy  bmi 24.9  Pap 3/16-negative  No new partners  Will wait for next pap until 2019 (bleeding today) Last menses 2/20- then in April had a week of spotting , then this am she started spotting again  Hot flashes/nt sweats not too bad yet    Mammogram 3/18=nl  Self breast exam- no lumps or changes   Colonoscopy 5/17- 2 tubular adenomas - 5 year recall   Tetanus shot 3/17  Hx of hyperlipidemia Lab Results  Component Value Date   CHOL 166 08/16/2016   CHOL 224 (H) 05/12/2015   CHOL 165 05/13/2014   Lab Results  Component Value Date   HDL 85 08/16/2016   HDL 85 05/12/2015   HDL 81 05/13/2014   Lab Results  Component Value Date   LDLCALC 67 08/16/2016   LDLCALC 126 (H) 05/12/2015   LDLCALC 58 05/13/2014   Lab Results  Component Value Date   TRIG 68 08/16/2016   TRIG 67 05/12/2015   TRIG 128 05/13/2014   Lab Results  Component Value Date   CHOLHDL 2.0 08/16/2016   CHOLHDL 2.6 05/12/2015   CHOLHDL 2.0 05/13/2014   No results found for: LDLDIRECT On 5 mg of atorvastatin daily , also good diet   Hx of anemia - chronic /rel to menses and also family hx Lab Results  Component Value Date   WBC 4.6 08/16/2016   HGB 11.2 08/16/2016   HCT 35.3 08/16/2016   MCV 77 (L) 08/16/2016   PLT 222 08/16/2016   Has taken iron - continues the 325 mg ferrous sulfate Hb is stable     Chemistry      Component Value Date/Time   NA 141 08/16/2016 1442   K 4.1 08/16/2016 1442   CL 102 08/16/2016 1442   CO2 24  08/16/2016 1442   BUN 10 08/16/2016 1442   CREATININE 0.86 08/16/2016 1442      Component Value Date/Time   CALCIUM 9.5 08/16/2016 1442   ALKPHOS 58 08/16/2016 1442   AST 18 08/16/2016 1442   ALT 9 08/16/2016 1442   BILITOT 0.8 08/16/2016 1442      Lab Results  Component Value Date   TSH 1.250 08/16/2016     Patient Active Problem List   Diagnosis Date Noted  . Colon cancer screening 05/18/2015  . Screening for HIV (human immunodeficiency virus) 05/13/2014  . Encounter for routine gynecological examination 01/17/2011  . GERD (gastroesophageal reflux disease) 01/17/2011  . Routine general medical examination at a health care facility 01/09/2011  . Other screening mammogram 01/04/2011  . HYPERCHOLESTEROLEMIA 11/19/2006  . Anemia 11/19/2006   Past Medical History:  Diagnosis Date  . Anemia    NOS since childhood runs in family  . Hyperlipidemia    Past Surgical History:  Procedure Laterality Date  . CESAREAN SECTION    . WISDOM TOOTH EXTRACTION     Social History  Substance Use Topics  .  Smoking status: Never Smoker  . Smokeless tobacco: Never Used  . Alcohol use No   Family History  Problem Relation Age of Onset  . Cancer Father        stomach CA  . Hyperlipidemia Mother   . Hypertension Mother   . Anemia Mother   . Colon cancer Neg Hx   . Breast cancer Neg Hx    No Known Allergies Current Outpatient Prescriptions on File Prior to Visit  Medication Sig Dispense Refill  . ferrous sulfate 325 (65 FE) MG tablet Take 1 tablet (325 mg total) by mouth 2 (two) times daily with a meal. 1 tablet 0   No current facility-administered medications on file prior to visit.     Review of Systems Review of Systems  Constitutional: Negative for fever, appetite change, fatigue and unexpected weight change.  Eyes: Negative for pain and visual disturbance.  Respiratory: Negative for cough and shortness of breath.   Cardiovascular: Negative for cp or palpitations      Gastrointestinal: Negative for nausea, diarrhea and constipation.  Genitourinary: Negative for urgency and frequency.  Skin: Negative for pallor or rash   Neurological: Negative for weakness, light-headedness, numbness and headaches.  Hematological: Negative for adenopathy. Does not bruise/bleed easily.  Psychiatric/Behavioral: Negative for dysphoric mood. The patient is not nervous/anxious.         Objective:   Physical Exam  Constitutional: She appears well-developed and well-nourished. No distress.  Well appearing   HENT:  Head: Normocephalic and atraumatic.  Right Ear: External ear normal.  Left Ear: External ear normal.  Mouth/Throat: Oropharynx is clear and moist.  Eyes: Conjunctivae and EOM are normal. Pupils are equal, round, and reactive to light. No scleral icterus.  Neck: Normal range of motion. Neck supple. No JVD present. Carotid bruit is not present. No thyromegaly present.  Cardiovascular: Normal rate, regular rhythm, normal heart sounds and intact distal pulses.  Exam reveals no gallop.   Pulmonary/Chest: Effort normal and breath sounds normal. No respiratory distress. She has no wheezes. She exhibits no tenderness.  Abdominal: Soft. Bowel sounds are normal. She exhibits no distension, no abdominal bruit and no mass. There is no tenderness.  Low abd keloid scar from recent abdominoplasty  Genitourinary: No breast swelling, tenderness, discharge or bleeding.  Genitourinary Comments: Breast exam: No mass, nodules, thickening, tenderness, bulging, retraction, inflamation, nipple discharge or skin changes noted.  No axillary or clavicular LA.     Keloid scar (baseline) on R breast  Musculoskeletal: Normal range of motion. She exhibits no edema or tenderness.  Lymphadenopathy:    She has no cervical adenopathy.  Neurological: She is alert. She has normal reflexes. No cranial nerve deficit. She exhibits normal muscle tone. Coordination normal.  Skin: Skin is warm and dry.  No rash noted. No erythema. No pallor.  Skin tags noted/ face  Psychiatric: She has a normal mood and affect.          Assessment & Plan:   Problem List Items Addressed This Visit      Other   Anemia    Stable on ferrous sulfate bid  Chronic/ fam hx and irreg menses Continue to follow  Is utd on colonoscopy       HYPERCHOLESTEROLEMIA    Disc goals for lipids and reasons to control them Rev labs with pt Rev low sat fat diet in detail Improved with 5 mg of atorvastatin - refilled        Relevant Medications   atorvastatin (LIPITOR)  10 MG tablet   Routine general medical examination at a health care facility - Primary    Reviewed health habits including diet and exercise and skin cancer prevention Reviewed appropriate screening tests for age  Also reviewed health mt list, fam hx and immunization status , as well as social and family history   See HPI Labs reviewed Stable chronic anemia  Peri menopausal- antic guidance  Will do pap next time  Disc imp of ca and D for bone health

## 2016-08-22 NOTE — Patient Instructions (Addendum)
Try to get 1200-1500 mg of calcium per day with at least 1000 iu of vitamin D - for bone health   Keep up the good work with diet and exercise  Take care

## 2016-08-22 NOTE — Assessment & Plan Note (Signed)
Disc goals for lipids and reasons to control them Rev labs with pt Rev low sat fat diet in detail Improved with 5 mg of atorvastatin - refilled

## 2016-08-22 NOTE — Assessment & Plan Note (Signed)
Stable on ferrous sulfate bid  Chronic/ fam hx and irreg menses Continue to follow  Is utd on colonoscopy

## 2017-04-16 ENCOUNTER — Other Ambulatory Visit: Payer: Self-pay | Admitting: Family Medicine

## 2017-04-16 DIAGNOSIS — Z1231 Encounter for screening mammogram for malignant neoplasm of breast: Secondary | ICD-10-CM

## 2017-05-01 ENCOUNTER — Other Ambulatory Visit: Payer: Self-pay | Admitting: Family Medicine

## 2017-05-23 ENCOUNTER — Ambulatory Visit
Admission: RE | Admit: 2017-05-23 | Discharge: 2017-05-23 | Disposition: A | Discharge status: Home / Self Care | Payer: 59 | Source: Ambulatory Visit | Attending: Family Medicine | Admitting: Family Medicine

## 2017-05-23 DIAGNOSIS — Z1231 Encounter for screening mammogram for malignant neoplasm of breast: Secondary | ICD-10-CM

## 2017-07-24 ENCOUNTER — Encounter: Payer: Self-pay | Admitting: Family Medicine

## 2017-08-05 ENCOUNTER — Telehealth: Payer: Self-pay | Admitting: Family Medicine

## 2017-08-05 DIAGNOSIS — Z0000 Encounter for general adult medical examination without abnormal findings: Secondary | ICD-10-CM

## 2017-08-05 DIAGNOSIS — E7800 Pure hypercholesterolemia, unspecified: Secondary | ICD-10-CM

## 2017-08-05 DIAGNOSIS — D509 Iron deficiency anemia, unspecified: Secondary | ICD-10-CM

## 2017-08-05 NOTE — Telephone Encounter (Signed)
-----   Message from Lendon Collar, RT sent at 07/31/2017 10:33 AM EDT ----- Regarding: Lab orders for Thursday 08/09/17 Please enter CPE lab orders for 08/09/17. Thanks-Lauren

## 2017-08-09 ENCOUNTER — Other Ambulatory Visit (INDEPENDENT_AMBULATORY_CARE_PROVIDER_SITE_OTHER): Payer: 59

## 2017-08-09 DIAGNOSIS — D509 Iron deficiency anemia, unspecified: Secondary | ICD-10-CM

## 2017-08-09 DIAGNOSIS — E7800 Pure hypercholesterolemia, unspecified: Secondary | ICD-10-CM

## 2017-08-09 DIAGNOSIS — Z0000 Encounter for general adult medical examination without abnormal findings: Secondary | ICD-10-CM | POA: Diagnosis not present

## 2017-08-09 NOTE — Addendum Note (Signed)
Addended by: Ellamae Sia on: 08/09/2017 07:56 AM   Modules accepted: Orders

## 2017-08-10 LAB — COMPREHENSIVE METABOLIC PANEL
ALBUMIN: 4.6 g/dL (ref 3.5–5.5)
ALK PHOS: 58 IU/L (ref 39–117)
ALT: 12 IU/L (ref 0–32)
AST: 22 IU/L (ref 0–40)
Albumin/Globulin Ratio: 1.6 (ref 1.2–2.2)
BUN / CREAT RATIO: 11 (ref 9–23)
BUN: 10 mg/dL (ref 6–24)
Bilirubin Total: 0.8 mg/dL (ref 0.0–1.2)
CALCIUM: 9.9 mg/dL (ref 8.7–10.2)
CO2: 25 mmol/L (ref 20–29)
CREATININE: 0.89 mg/dL (ref 0.57–1.00)
Chloride: 102 mmol/L (ref 96–106)
GFR calc Af Amer: 86 mL/min/{1.73_m2} (ref >59)
GFR calc non Af Amer: 75 mL/min/{1.73_m2} (ref >59)
GLUCOSE: 96 mg/dL (ref 65–99)
Globulin, Total: 2.9 g/dL (ref 1.5–4.5)
Potassium: 4.6 mmol/L (ref 3.5–5.2)
Sodium: 142 mmol/L (ref 134–144)
Total Protein: 7.5 g/dL (ref 6.0–8.5)

## 2017-08-10 LAB — CBC WITH DIFFERENTIAL/PLATELET
BASOS ABS: 0 10*3/uL (ref 0.0–0.2)
Basos: 0 %
EOS (ABSOLUTE): 0.1 10*3/uL (ref 0.0–0.4)
Eos: 1 %
Hematocrit: 35.1 % (ref 34.0–46.6)
Hemoglobin: 11 g/dL — ABNORMAL LOW (ref 11.1–15.9)
IMMATURE GRANULOCYTES: 0 %
Immature Grans (Abs): 0 10*3/uL (ref 0.0–0.1)
LYMPHS ABS: 1.3 10*3/uL (ref 0.7–3.1)
LYMPHS: 24 %
MCH: 23.8 pg — ABNORMAL LOW (ref 26.6–33.0)
MCHC: 31.3 g/dL — AB (ref 31.5–35.7)
MCV: 76 fL — ABNORMAL LOW (ref 79–97)
Monocytes Absolute: 0.3 10*3/uL (ref 0.1–0.9)
Monocytes: 6 %
NEUTROS PCT: 69 %
Neutrophils Absolute: 3.8 10*3/uL (ref 1.4–7.0)
Platelets: 171 10*3/uL (ref 150–450)
RBC: 4.63 x10E6/uL (ref 3.77–5.28)
RDW: 13.8 % (ref 12.3–15.4)
WBC: 5.6 10*3/uL (ref 3.4–10.8)

## 2017-08-10 LAB — FERRITIN: Ferritin: 49 ng/mL (ref 15–150)

## 2017-08-10 LAB — LIPID PANEL
Chol/HDL Ratio: 2.2 ratio (ref 0.0–4.4)
Cholesterol, Total: 151 mg/dL (ref 100–199)
HDL: 68 mg/dL (ref >39)
LDL CALC: 65 mg/dL (ref 0–99)
TRIGLYCERIDES: 88 mg/dL (ref 0–149)
VLDL Cholesterol Cal: 18 mg/dL (ref 5–40)

## 2017-08-10 LAB — TSH: TSH: 1.42 u[IU]/mL (ref 0.450–4.500)

## 2017-08-27 ENCOUNTER — Encounter: Payer: Self-pay | Admitting: Family Medicine

## 2017-08-27 ENCOUNTER — Other Ambulatory Visit (HOSPITAL_COMMUNITY)
Admission: RE | Admit: 2017-08-27 | Discharge: 2017-08-27 | Disposition: A | Discharge status: Home / Self Care | Payer: 59 | Source: Ambulatory Visit | Attending: Family Medicine | Admitting: Family Medicine

## 2017-08-27 ENCOUNTER — Ambulatory Visit (INDEPENDENT_AMBULATORY_CARE_PROVIDER_SITE_OTHER): Payer: 59 | Admitting: Family Medicine

## 2017-08-27 VITALS — BP 134/80 | HR 55 | Temp 99.1°F | Ht 66.25 in | Wt 170.0 lb

## 2017-08-27 DIAGNOSIS — E7800 Pure hypercholesterolemia, unspecified: Secondary | ICD-10-CM | POA: Diagnosis not present

## 2017-08-27 DIAGNOSIS — Z01419 Encounter for gynecological examination (general) (routine) without abnormal findings: Secondary | ICD-10-CM | POA: Diagnosis not present

## 2017-08-27 DIAGNOSIS — Z0000 Encounter for general adult medical examination without abnormal findings: Secondary | ICD-10-CM | POA: Diagnosis not present

## 2017-08-27 DIAGNOSIS — R7303 Prediabetes: Secondary | ICD-10-CM | POA: Insufficient documentation

## 2017-08-27 DIAGNOSIS — D509 Iron deficiency anemia, unspecified: Secondary | ICD-10-CM | POA: Diagnosis not present

## 2017-08-27 DIAGNOSIS — R7309 Other abnormal glucose: Secondary | ICD-10-CM

## 2017-08-27 MED ORDER — ATORVASTATIN CALCIUM 10 MG PO TABS: 5.0000 mg | ORAL_TABLET | Freq: Every day | ORAL | 3 refills | Status: DC

## 2017-08-27 NOTE — Assessment & Plan Note (Signed)
Routine exam No c/o  Menopausal  Pap done

## 2017-08-27 NOTE — Assessment & Plan Note (Signed)
Disc goals for lipids and reasons to control them Rev last labs with pt Rev low sat fat diet in detail  Good profile with atorvastatin and diet 

## 2017-08-27 NOTE — Assessment & Plan Note (Signed)
Per pt A1C was up at work  disc imp of low glycemic diet and wt loss to prevent DM2

## 2017-08-27 NOTE — Patient Instructions (Addendum)
Get back to regular cardio and healthy diet  Try to get most of your carbohydrates from produce (with the exception of white potatoes)  Eat less bread/pasta/rice/snack foods/cereals/sweets and other items from the middle of the grocery store (processed carbs)   If you are interested in the new shingles vaccine (Shingrix) - call your local pharmacy to check on coverage and availability  If affordable - have your pharmacy put you on a wait list     take care of yourself   Pap done today

## 2017-08-27 NOTE — Assessment & Plan Note (Signed)
Stable with ferrrous sulfate qd  fam hx of iron abs problems No longer having menses  Diet is balanced  Continue to follow -may need IV iron in the future if it worsens

## 2017-08-27 NOTE — Progress Notes (Signed)
Subjective:    Patient ID: Leslie Gomez, female    DOB: [birth year], 52 y.o.   MRN: [id number]  HPI Here for health maintenance exam and to review chronic medical problems    Has been on vacation for 2 weeks    Wt Readings from Last 3 Encounters:  08/27/17 170 lb (77.1 kg)  08/22/16 156 lb 12 oz (71.1 kg)  07/09/15 165 lb (74.8 kg)  weight went up - less cardio / more weight training (some muscle mass)  Also has not run this year  Eating is ok - that slipped also  Also menopause  27.23 kg/m   bp is up at home occ Keeps an eye on it  BP Readings from Last 3 Encounters:  08/27/17 134/80  08/22/16 126/86  07/09/15 (!) 145/69  will watch this  Has elevated bp in the family    Pap 3/16 negative  Due for pap today  Menses-none/ last one was a year ago  Hot flashes come and go / now and then/ == getting through it    Flu shot 10/18 Tetanus shot 3/17 Zoster status -interested in the shingrix when avail   Mammogram 4/19-neg Self exam - no lumps   Colonoscopy 5/17 - tubular adenoma   5 y recall   hyperlipidemia Lab Results  Component Value Date   CHOL 151 08/09/2017   CHOL 166 08/16/2016   CHOL 224 (H) 05/12/2015   Lab Results  Component Value Date   HDL 68 08/09/2017   HDL 85 08/16/2016   HDL 85 05/12/2015   Lab Results  Component Value Date   LDLCALC 65 08/09/2017   LDLCALC 67 08/16/2016   LDLCALC 126 (H) 05/12/2015   Lab Results  Component Value Date   TRIG 88 08/09/2017   TRIG 68 08/16/2016   TRIG 67 05/12/2015   Lab Results  Component Value Date   CHOLHDL 2.2 08/09/2017   CHOLHDL 2.0 08/16/2016   CHOLHDL 2.6 05/12/2015   No results found for: LDLDIRECT  Atorvastatin and diet  Lower HDL - ? Due to menopause and less cardio   Hx of anemia rel to fam hx and menses  Lab Results  Component Value Date   WBC 5.6 08/09/2017   HGB 11.0 (L) 08/09/2017   HCT 35.1 08/09/2017   MCV 76 (L) 08/09/2017   PLT 171 08/09/2017   Fairly stable    Iron intake - 325 mg of ferrous sulfate daily  Iron def- in mother/ even passed menopause (needed iron infusions)  No sickle cell in family  Lab Results  Component Value Date   FERRITIN 49 08/09/2017   Lab Results  Component Value Date   CREATININE 0.89 08/09/2017   BUN 10 08/09/2017   NA 142 08/09/2017   K 4.6 08/09/2017   CL 102 08/09/2017   CO2 25 08/09/2017   Lab Results  Component Value Date   ALT 12 08/09/2017   AST 22 08/09/2017   ALKPHOS 58 08/09/2017   BILITOT 0.8 08/09/2017    Lab Results  Component Value Date   TSH 1.420 08/09/2017   glucose 96   A1C at work- is in low 6 range   Patient Active Problem List   Diagnosis Date Noted  . Elevated glucose 08/27/2017  . Colon cancer screening 05/18/2015  . Screening for HIV (human immunodeficiency virus) 05/13/2014  . Encounter for routine gynecological examination 01/17/2011  . GERD (gastroesophageal reflux disease) 01/17/2011  . Routine general medical examination at a health  care facility 01/09/2011  . Other screening mammogram 01/04/2011  . HYPERCHOLESTEROLEMIA 11/19/2006  . Anemia 11/19/2006   Past Medical History:  Diagnosis Date  . Anemia    NOS since childhood runs in family  . Hyperlipidemia    Past Surgical History:  Procedure Laterality Date  . ABDOMINOPLASTY  2018  . CESAREAN SECTION    . WISDOM TOOTH EXTRACTION     Social History   Tobacco Use  . Smoking status: Never Smoker  . Smokeless tobacco: Never Used  Substance Use Topics  . Alcohol use: No    Alcohol/week: 0.0 oz  . Drug use: No   Family History  Problem Relation Age of Onset  . Cancer Father        stomach CA  . Hyperlipidemia Mother   . Hypertension Mother   . Anemia Mother   . Colon cancer Neg Hx   . Breast cancer Neg Hx    No Known Allergies Current Outpatient Medications on File Prior to Visit  Medication Sig Dispense Refill  . ferrous sulfate 325 (65 FE) MG tablet Take 1 tablet (325 mg total) by mouth 2 (two)  times daily with a meal. 1 tablet 0   No current facility-administered medications on file prior to visit.     Review of Systems  Constitutional: Negative for activity change, appetite change, fatigue, fever and unexpected weight change.  HENT: Negative for congestion, ear pain, rhinorrhea, sinus pressure and sore throat.   Eyes: Negative for pain, redness and visual disturbance.  Respiratory: Negative for cough, shortness of breath and wheezing.   Cardiovascular: Negative for chest pain and palpitations.  Gastrointestinal: Negative for abdominal pain, blood in stool, constipation and diarrhea.  Endocrine: Negative for polydipsia and polyuria.  Genitourinary: Negative for dysuria, frequency and urgency.  Musculoskeletal: Negative for arthralgias, back pain and myalgias.  Skin: Negative for pallor and rash.  Allergic/Immunologic: Negative for environmental allergies.  Neurological: Negative for dizziness, syncope and headaches.  Hematological: Negative for adenopathy. Does not bruise/bleed easily.  Psychiatric/Behavioral: Negative for decreased concentration and dysphoric mood. The patient is not nervous/anxious.        Objective:   Physical Exam  Constitutional: She appears well-developed and well-nourished. No distress.  overwt and well appearing   HENT:  Head: Normocephalic and atraumatic.  Right Ear: External ear normal.  Left Ear: External ear normal.  Mouth/Throat: Oropharynx is clear and moist.  Eyes: Pupils are equal, round, and reactive to light. Conjunctivae and EOM are normal. No scleral icterus.  Neck: Normal range of motion. Neck supple. No JVD present. Carotid bruit is not present. No thyromegaly present.  Cardiovascular: Normal rate, regular rhythm, normal heart sounds and intact distal pulses. Exam reveals no gallop.  Pulmonary/Chest: Effort normal and breath sounds normal. No respiratory distress. She has no wheezes. She exhibits no tenderness. No breast tenderness,  discharge or bleeding.  Abdominal: Soft. Bowel sounds are normal. She exhibits no distension, no abdominal bruit and no mass. There is no tenderness.  Genitourinary: No breast tenderness, discharge or bleeding.  Genitourinary Comments: Breast exam: No mass, nodules, thickening, tenderness, bulging, retraction, inflamation, nipple discharge or skin changes noted.  No axillary or clavicular LA.             Anus appears normal w/o hemorrhoids or masses     External genitalia : nl appearance and hair distribution/no lesions     Urethral meatus : nl size, no lesions or prolapse     Urethra: no masses, tenderness  or scarring    Bladder : no masses or tenderness     Vagina: nl general appearance, no discharge or  Lesions, no significant cystocele  or rectocele     Cervix: no lesions/ discharge or friability    Uterus: nl size, contour, position, and mobility (not fixed) , non tender    Adnexa : no masses, tenderness, enlargement or nodularity        Musculoskeletal: Normal range of motion. She exhibits no edema or tenderness.  Lymphadenopathy:    She has no cervical adenopathy.  Neurological: She is alert. She has normal reflexes. She displays normal reflexes. No cranial nerve deficit. She exhibits normal muscle tone. Coordination normal.  Skin: Skin is warm and dry. No rash noted. No erythema. No pallor.  Few lentigines  Psychiatric: She has a normal mood and affect.  Pleasant           Assessment & Plan:   Problem List Items Addressed This Visit      Other   Anemia    Stable with ferrrous sulfate qd  fam hx of iron abs problems No longer having menses  Diet is balanced  Continue to follow -may need IV iron in the future if it worsens      Elevated glucose    Per pt A1C was up at work  disc imp of low glycemic diet and wt loss to prevent DM2       Encounter for routine gynecological examination    Routine exam No c/o  Menopausal  Pap done         Relevant Orders   Cytology - PAP   HYPERCHOLESTEROLEMIA    Disc goals for lipids and reasons to control them Rev last labs with pt Rev low sat fat diet in detail  Good profile with atorvastatin and diet       Relevant Medications   atorvastatin (LIPITOR) 10 MG tablet   Routine general medical examination at a health care facility - Primary    Reviewed health habits including diet and exercise and skin cancer prevention Reviewed appropriate screening tests for age  Also reviewed health mt list, fam hx and immunization status , as well as social and family history   See HPI Labs reviewed  Disc shingrix vaccine  Gyn exam today  Disc low glycemic diet

## 2017-08-27 NOTE — Assessment & Plan Note (Signed)
Reviewed health habits including diet and exercise and skin cancer prevention Reviewed appropriate screening tests for age  Also reviewed health mt list, fam hx and immunization status , as well as social and family history   See HPI Labs reviewed  Disc shingrix vaccine  Gyn exam today  Disc low glycemic diet

## 2017-08-30 LAB — CYTOLOGY - PAP
Diagnosis: NEGATIVE
HPV: NOT DETECTED

## 2017-09-10 DIAGNOSIS — J45909 Unspecified asthma, uncomplicated: Secondary | ICD-10-CM | POA: Diagnosis not present

## 2017-09-10 DIAGNOSIS — J040 Acute laryngitis: Secondary | ICD-10-CM | POA: Diagnosis not present

## 2018-04-09 ENCOUNTER — Other Ambulatory Visit: Payer: Self-pay | Admitting: Family Medicine

## 2018-04-09 DIAGNOSIS — Z1231 Encounter for screening mammogram for malignant neoplasm of breast: Secondary | ICD-10-CM

## 2018-05-29 ENCOUNTER — Ambulatory Visit: Payer: 59

## 2018-07-10 ENCOUNTER — Ambulatory Visit
Admission: RE | Admit: 2018-07-10 | Discharge: 2018-07-10 | Disposition: A | Discharge status: Home / Self Care | Payer: 59 | Source: Ambulatory Visit | Attending: Family Medicine | Admitting: Family Medicine

## 2018-07-10 ENCOUNTER — Other Ambulatory Visit: Payer: Self-pay

## 2018-07-10 DIAGNOSIS — Z1231 Encounter for screening mammogram for malignant neoplasm of breast: Secondary | ICD-10-CM | POA: Diagnosis not present

## 2018-07-12 ENCOUNTER — Ambulatory Visit: Payer: 59

## 2018-08-21 ENCOUNTER — Other Ambulatory Visit: Payer: Self-pay | Admitting: Family Medicine

## 2018-08-25 ENCOUNTER — Telehealth: Payer: Self-pay | Admitting: Family Medicine

## 2018-08-25 DIAGNOSIS — R7309 Other abnormal glucose: Secondary | ICD-10-CM

## 2018-08-25 DIAGNOSIS — E7800 Pure hypercholesterolemia, unspecified: Secondary | ICD-10-CM

## 2018-08-25 DIAGNOSIS — Z0000 Encounter for general adult medical examination without abnormal findings: Secondary | ICD-10-CM

## 2018-08-25 DIAGNOSIS — D509 Iron deficiency anemia, unspecified: Secondary | ICD-10-CM

## 2018-08-25 NOTE — Telephone Encounter (Signed)
-----   Message from Cloyd Stagers, RT sent at 08/22/2018  5:32 PM EDT ----- Regarding: Lab Orders for Monday 7.6.2020 Please Lab Orders for Monday 7.6.2020 Please Thank you, Dyke Maes RT(R)

## 2018-08-26 ENCOUNTER — Other Ambulatory Visit (INDEPENDENT_AMBULATORY_CARE_PROVIDER_SITE_OTHER): Payer: 59

## 2018-08-26 ENCOUNTER — Other Ambulatory Visit: Payer: 59

## 2018-08-26 DIAGNOSIS — Z0000 Encounter for general adult medical examination without abnormal findings: Secondary | ICD-10-CM

## 2018-08-26 DIAGNOSIS — E7800 Pure hypercholesterolemia, unspecified: Secondary | ICD-10-CM | POA: Diagnosis not present

## 2018-08-26 DIAGNOSIS — D509 Iron deficiency anemia, unspecified: Secondary | ICD-10-CM

## 2018-08-26 DIAGNOSIS — R7309 Other abnormal glucose: Secondary | ICD-10-CM

## 2018-08-26 NOTE — Addendum Note (Signed)
Addended by: Ellamae Sia on: 08/26/2018 03:07 PM   Modules accepted: Orders

## 2018-08-27 LAB — CBC WITH DIFFERENTIAL/PLATELET
Basophils Absolute: 0 10*3/uL (ref 0.0–0.2)
Basos: 0 %
EOS (ABSOLUTE): 0 10*3/uL (ref 0.0–0.4)
Eos: 0 %
Hematocrit: 34 % (ref 34.0–46.6)
Hemoglobin: 11.4 g/dL (ref 11.1–15.9)
Immature Grans (Abs): 0 10*3/uL (ref 0.0–0.1)
Immature Granulocytes: 0 %
Lymphocytes Absolute: 1.9 10*3/uL (ref 0.7–3.1)
Lymphs: 26 %
MCH: 25.2 pg — ABNORMAL LOW (ref 26.6–33.0)
MCHC: 33.5 g/dL (ref 31.5–35.7)
MCV: 75 fL — ABNORMAL LOW (ref 79–97)
Monocytes Absolute: 0.4 10*3/uL (ref 0.1–0.9)
Monocytes: 6 %
Neutrophils Absolute: 4.8 10*3/uL (ref 1.4–7.0)
Neutrophils: 68 %
RBC: 4.52 x10E6/uL (ref 3.77–5.28)
RDW: 13.1 % (ref 11.7–15.4)
WBC: 7.2 10*3/uL (ref 3.4–10.8)

## 2018-08-27 LAB — COMPREHENSIVE METABOLIC PANEL
ALT: 9 IU/L (ref 0–32)
AST: 20 IU/L (ref 0–40)
Albumin/Globulin Ratio: 1.7 (ref 1.2–2.2)
Albumin: 4.5 g/dL (ref 3.8–4.9)
Alkaline Phosphatase: 66 IU/L (ref 39–117)
BUN/Creatinine Ratio: 14 (ref 9–23)
BUN: 16 mg/dL (ref 6–24)
Bilirubin Total: 0.6 mg/dL (ref 0.0–1.2)
CO2: 25 mmol/L (ref 20–29)
Calcium: 9.8 mg/dL (ref 8.7–10.2)
Chloride: 100 mmol/L (ref 96–106)
Creatinine, Ser: 1.12 mg/dL — ABNORMAL HIGH (ref 0.57–1.00)
GFR calc Af Amer: 65 mL/min/{1.73_m2} (ref >59)
GFR calc non Af Amer: 56 mL/min/{1.73_m2} — ABNORMAL LOW (ref >59)
Globulin, Total: 2.6 g/dL (ref 1.5–4.5)
Glucose: 95 mg/dL (ref 65–99)
Potassium: 4.3 mmol/L (ref 3.5–5.2)
Sodium: 140 mmol/L (ref 134–144)
Total Protein: 7.1 g/dL (ref 6.0–8.5)

## 2018-08-27 LAB — LIPID PANEL
Chol/HDL Ratio: 2.2 ratio (ref 0.0–4.4)
Cholesterol, Total: 166 mg/dL (ref 100–199)
HDL: 76 mg/dL (ref >39)
LDL Calculated: 76 mg/dL (ref 0–99)
Triglycerides: 70 mg/dL (ref 0–149)
VLDL Cholesterol Cal: 14 mg/dL (ref 5–40)

## 2018-08-27 LAB — TSH: TSH: 2.06 u[IU]/mL (ref 0.450–4.500)

## 2018-08-27 LAB — FERRITIN: Ferritin: 163 ng/mL — ABNORMAL HIGH (ref 15–150)

## 2018-08-27 LAB — HEMOGLOBIN A1C
Est. average glucose Bld gHb Est-mCnc: 126 mg/dL
Hgb A1c MFr Bld: 6 % — ABNORMAL HIGH (ref 4.8–5.6)

## 2018-08-30 ENCOUNTER — Ambulatory Visit (INDEPENDENT_AMBULATORY_CARE_PROVIDER_SITE_OTHER): Payer: 59 | Admitting: Family Medicine

## 2018-08-30 ENCOUNTER — Encounter: Payer: Self-pay | Admitting: Family Medicine

## 2018-08-30 ENCOUNTER — Other Ambulatory Visit: Payer: Self-pay

## 2018-08-30 VITALS — BP 118/78 | HR 55 | Temp 97.2°F | Ht 66.0 in | Wt 164.2 lb

## 2018-08-30 DIAGNOSIS — Z0000 Encounter for general adult medical examination without abnormal findings: Secondary | ICD-10-CM | POA: Diagnosis not present

## 2018-08-30 DIAGNOSIS — E7800 Pure hypercholesterolemia, unspecified: Secondary | ICD-10-CM | POA: Diagnosis not present

## 2018-08-30 DIAGNOSIS — D509 Iron deficiency anemia, unspecified: Secondary | ICD-10-CM | POA: Diagnosis not present

## 2018-08-30 DIAGNOSIS — R7309 Other abnormal glucose: Secondary | ICD-10-CM | POA: Diagnosis not present

## 2018-08-30 MED ORDER — FERROUS SULFATE 325 (65 FE) MG PO TABS: ORAL_TABLET | ORAL | 0 refills | Status: DC

## 2018-08-30 NOTE — Patient Instructions (Addendum)
Get a flu shot this fall !  Cut iron back to a pill every other day  To prevent diabetes Try to get most of your carbohydrates from produce (with the exception of white potatoes)  Eat less bread/pasta/rice/snack foods/cereals/sweets and other items from the middle of the grocery store (processed carbs)  Also keep exercising   Take care of yourself!

## 2018-08-30 NOTE — Progress Notes (Signed)
Subjective:    Patient ID: Leslie Gomez, female    DOB: 04-19-1965, 53 y.o.   MRN: [id number]  HPI Here for health maintenance exam and to review chronic medical problems    Wt Readings from Last 3 Encounters:  08/30/18 164 lb 4 oz (74.5 kg)  08/27/17 170 lb (77.1 kg)  08/22/16 156 lb 12 oz (71.1 kg)  eating better being at home  Gym is closed so she is walking in the middle of the day (she is heat tolerant)  26.51 kg/m   Flu shot 10/19 Gets it yearly   Mammogram 5/20 Self breast exam - no lumps/changes   Colonoscopy 5/17 with a 5 y recall   Pap 7/19  Neg with neg HPV screen No gyn problems  Period is gone -none in over a year   Neg HIV 3/16   BP Readings from Last 3 Encounters:  08/30/18 118/78  08/27/17 134/80  08/22/16 126/86   Pulse Readings from Last 3 Encounters:  08/30/18 (!) 55  08/27/17 (!) 55  08/22/16 (!) 58   PHQ2- score of 0   Anemia  Lab Results  Component Value Date   WBC 7.2 08/26/2018   HGB 11.4 08/26/2018   HCT 34.0 08/26/2018   MCV 75 (L) 08/26/2018   PLT CANCELED 08/26/2018   taking iron  Heavy menses and fam hx of iron def  Lab Results  Component Value Date   FERRITIN 163 (H) 08/26/2018    H/o elevated glucose Lab Results  Component Value Date   HGBA1C 6.0 (H) 08/26/2018   Stable  No diabetes in the family Eats well   Hyperlipidemia Lab Results  Component Value Date   CHOL 166 08/26/2018   CHOL 151 08/09/2017   CHOL 166 08/16/2016   Lab Results  Component Value Date   HDL 76 08/26/2018   HDL 68 08/09/2017   HDL 85 08/16/2016   Lab Results  Component Value Date   LDLCALC 76 08/26/2018   LDLCALC 65 08/09/2017   LDLCALC 67 08/16/2016   Lab Results  Component Value Date   TRIG 70 08/26/2018   TRIG 88 08/09/2017   TRIG 68 08/16/2016   Lab Results  Component Value Date   CHOLHDL 2.2 08/26/2018   CHOLHDL 2.2 08/09/2017   CHOLHDL 2.0 08/16/2016   No results found for: LDLDIRECT   Atorvastatin  and diet  Well controlled    Lab Results  Component Value Date   CREATININE 1.12 (H) 08/26/2018   BUN 16 08/26/2018   NA 140 08/26/2018   K 4.3 08/26/2018   CL 100 08/26/2018   CO2 25 08/26/2018  took spin class right before this is drawn - may have been low on fluid  Lab Results  Component Value Date   ALT 9 08/26/2018   AST 20 08/26/2018   ALKPHOS 66 08/26/2018   BILITOT 0.6 08/26/2018   Lab Results  Component Value Date   TSH 2.060 08/26/2018     Patient Active Problem List   Diagnosis Date Noted  . Elevated glucose 08/27/2017  . Colon cancer screening 05/18/2015  . Screening for HIV (human immunodeficiency virus) 05/13/2014  . Encounter for routine gynecological examination 01/17/2011  . GERD (gastroesophageal reflux disease) 01/17/2011  . Routine general medical examination at a health care facility 01/09/2011  . Other screening mammogram 01/04/2011  . HYPERCHOLESTEROLEMIA 11/19/2006  . Anemia 11/19/2006   Past Medical History:  Diagnosis Date  . Anemia    NOS since childhood  runs in family  . Hyperlipidemia    Past Surgical History:  Procedure Laterality Date  . ABDOMINOPLASTY  2018  . CESAREAN SECTION    . WISDOM TOOTH EXTRACTION     Social History   Tobacco Use  . Smoking status: Never Smoker  . Smokeless tobacco: Never Used  Substance Use Topics  . Alcohol use: No    Alcohol/week: 0.0 standard drinks  . Drug use: No   Family History  Problem Relation Age of Onset  . Cancer Father        stomach CA  . Hyperlipidemia Mother   . Hypertension Mother   . Anemia Mother   . Colon cancer Neg Hx   . Breast cancer Neg Hx    No Known Allergies Current Outpatient Medications on File Prior to Visit  Medication Sig Dispense Refill  . atorvastatin (LIPITOR) 10 MG tablet TAKE ONE-HALF TABLET BY  MOUTH DAILY 45 tablet 1   No current facility-administered medications on file prior to visit.     Review of Systems  Constitutional: Negative for  activity change, appetite change, fatigue, fever and unexpected weight change.  HENT: Negative for congestion, ear pain, rhinorrhea, sinus pressure and sore throat.   Eyes: Negative for pain, redness and visual disturbance.  Respiratory: Negative for cough, shortness of breath and wheezing.   Cardiovascular: Negative for chest pain and palpitations.  Gastrointestinal: Negative for abdominal pain, blood in stool, constipation and diarrhea.  Endocrine: Negative for polydipsia and polyuria.  Genitourinary: Negative for dysuria, frequency and urgency.  Musculoskeletal: Negative for arthralgias, back pain and myalgias.  Skin: Negative for pallor and rash.  Allergic/Immunologic: Negative for environmental allergies.  Neurological: Negative for dizziness, syncope and headaches.  Hematological: Negative for adenopathy. Does not bruise/bleed easily.  Psychiatric/Behavioral: Negative for decreased concentration and dysphoric mood. The patient is not nervous/anxious.        Objective:   Physical Exam Constitutional:      General: She is not in acute distress.    Appearance: Normal appearance. She is well-developed and normal weight. She is not ill-appearing or diaphoretic.  HENT:     Head: Normocephalic and atraumatic.     Right Ear: Tympanic membrane, ear canal and external ear normal.     Left Ear: Tympanic membrane, ear canal and external ear normal.     Nose: Nose normal.     Mouth/Throat:     Mouth: Mucous membranes are moist.     Pharynx: Oropharynx is clear.  Eyes:     General: No scleral icterus.    Conjunctiva/sclera: Conjunctivae normal.     Pupils: Pupils are equal, round, and reactive to light.  Neck:     Musculoskeletal: Normal range of motion and neck supple.     Thyroid: No thyromegaly.     Vascular: No carotid bruit or JVD.  Cardiovascular:     Rate and Rhythm: Normal rate and regular rhythm.     Heart sounds: Normal heart sounds. No gallop.   Pulmonary:     Effort:  Pulmonary effort is normal. No respiratory distress.     Breath sounds: Normal breath sounds. No wheezing.  Chest:     Chest wall: No tenderness.  Abdominal:     General: Bowel sounds are normal. There is no distension or abdominal bruit.     Palpations: Abdomen is soft. There is no mass.     Tenderness: There is no abdominal tenderness.  Musculoskeletal: Normal range of motion.  General: No tenderness.  Lymphadenopathy:     Cervical: No cervical adenopathy.  Skin:    General: Skin is warm and dry.     Coloration: Skin is not pale.     Findings: No erythema or rash.  Neurological:     Mental Status: She is alert.     Cranial Nerves: No cranial nerve deficit.     Motor: No abnormal muscle tone.     Coordination: Coordination normal.     Gait: Gait normal.     Deep Tendon Reflexes: Reflexes are normal and symmetric. Reflexes normal.  Psychiatric:        Mood and Affect: Mood normal.           Assessment & Plan:   Problem List Items Addressed This Visit      Other   HYPERCHOLESTEROLEMIA    Disc goals for lipids and reasons to control them Rev last labs with pt Rev low sat fat diet in detail  Well controlled with atorvastatin and diet       Anemia    Anemia is baseline w/o loss from menses Ferritin is high with current iron repl Will cut back to feso4 325 mg every other day  Continue to follow  No symptoms      Relevant Medications   ferrous sulfate 325 (65 FE) MG tablet   Routine general medical examination at a health care facility - Primary    Reviewed health habits including diet and exercise and skin cancer prevention Reviewed appropriate screening tests for age  Also reviewed health mt list, fam hx and immunization status , as well as social and family history   See HPI Labs reviewed  Enc healthy low glycemic diet  Will cut back on iron       Elevated glucose    Lab Results  Component Value Date   HGBA1C 6.0 (H) 08/26/2018   This is stable  disc imp of low glycemic diet and wt loss to prevent DM2  Good habits

## 2018-09-01 NOTE — Assessment & Plan Note (Signed)
Disc goals for lipids and reasons to control them Rev last labs with pt Rev low sat fat diet in detail  Well controlled with atorvastatin and diet

## 2018-09-01 NOTE — Assessment & Plan Note (Signed)
Lab Results  Component Value Date   HGBA1C 6.0 (H) 08/26/2018   This is stable disc imp of low glycemic diet and wt loss to prevent DM2  Good habits

## 2018-09-01 NOTE — Assessment & Plan Note (Signed)
Reviewed health habits including diet and exercise and skin cancer prevention Reviewed appropriate screening tests for age  Also reviewed health mt list, fam hx and immunization status , as well as social and family history   See HPI Labs reviewed  Enc healthy low glycemic diet  Will cut back on iron

## 2018-09-01 NOTE — Assessment & Plan Note (Signed)
Anemia is baseline w/o loss from menses Ferritin is high with current iron repl Will cut back to feso4 325 mg every other day  Continue to follow  No symptoms

## 2019-02-04 ENCOUNTER — Other Ambulatory Visit: Payer: Self-pay | Admitting: Family Medicine

## 2019-05-27 ENCOUNTER — Other Ambulatory Visit: Payer: Self-pay | Admitting: Family Medicine

## 2019-05-27 DIAGNOSIS — Z1231 Encounter for screening mammogram for malignant neoplasm of breast: Secondary | ICD-10-CM

## 2019-07-22 ENCOUNTER — Ambulatory Visit: Payer: 59

## 2019-07-24 ENCOUNTER — Ambulatory Visit
Admission: RE | Admit: 2019-07-24 | Discharge: 2019-07-24 | Disposition: A | Discharge status: Home / Self Care | Payer: 59 | Source: Ambulatory Visit | Attending: Family Medicine | Admitting: Family Medicine

## 2019-07-24 ENCOUNTER — Other Ambulatory Visit: Payer: Self-pay

## 2019-07-24 DIAGNOSIS — Z1231 Encounter for screening mammogram for malignant neoplasm of breast: Secondary | ICD-10-CM

## 2019-08-28 ENCOUNTER — Telehealth: Payer: Self-pay | Admitting: Family Medicine

## 2019-08-28 DIAGNOSIS — E7800 Pure hypercholesterolemia, unspecified: Secondary | ICD-10-CM

## 2019-08-28 DIAGNOSIS — Z0000 Encounter for general adult medical examination without abnormal findings: Secondary | ICD-10-CM

## 2019-08-28 DIAGNOSIS — R7309 Other abnormal glucose: Secondary | ICD-10-CM

## 2019-08-28 NOTE — Telephone Encounter (Signed)
-----   Message from Cloyd Stagers, RT sent at 08/13/2019  2:11 PM EDT ----- Regarding: Lab Orders for Friday 7.9.2021 Please place lab orders for Friday 7.9.2021, office visit for physical on Tuesday 7.13.2021 Thank you, Dyke Maes RT(R)

## 2019-08-29 ENCOUNTER — Other Ambulatory Visit (INDEPENDENT_AMBULATORY_CARE_PROVIDER_SITE_OTHER): Payer: 59

## 2019-08-29 ENCOUNTER — Other Ambulatory Visit: Payer: Self-pay

## 2019-08-29 DIAGNOSIS — Z0000 Encounter for general adult medical examination without abnormal findings: Secondary | ICD-10-CM

## 2019-08-29 DIAGNOSIS — E7800 Pure hypercholesterolemia, unspecified: Secondary | ICD-10-CM

## 2019-08-29 DIAGNOSIS — R7309 Other abnormal glucose: Secondary | ICD-10-CM

## 2019-08-29 NOTE — Addendum Note (Signed)
Addended by: Ellamae Sia on: 08/29/2019 07:38 AM   Modules accepted: Orders

## 2019-08-30 LAB — COMPREHENSIVE METABOLIC PANEL
ALT: 12 IU/L (ref 0–32)
AST: 20 IU/L (ref 0–40)
Albumin/Globulin Ratio: 1.7 (ref 1.2–2.2)
Albumin: 4.3 g/dL (ref 3.8–4.9)
Alkaline Phosphatase: 68 IU/L (ref 48–121)
BUN/Creatinine Ratio: 21 (ref 9–23)
BUN: 15 mg/dL (ref 6–24)
Bilirubin Total: 0.6 mg/dL (ref 0.0–1.2)
CO2: 27 mmol/L (ref 20–29)
Calcium: 9 mg/dL (ref 8.7–10.2)
Chloride: 105 mmol/L (ref 96–106)
Creatinine, Ser: 0.73 mg/dL (ref 0.57–1.00)
GFR calc Af Amer: 108 mL/min/{1.73_m2} (ref >59)
GFR calc non Af Amer: 94 mL/min/{1.73_m2} (ref >59)
Globulin, Total: 2.5 g/dL (ref 1.5–4.5)
Glucose: 97 mg/dL (ref 65–99)
Potassium: 4.3 mmol/L (ref 3.5–5.2)
Sodium: 142 mmol/L (ref 134–144)
Total Protein: 6.8 g/dL (ref 6.0–8.5)

## 2019-08-30 LAB — HEMOGLOBIN A1C
Est. average glucose Bld gHb Est-mCnc: 123 mg/dL
Hgb A1c MFr Bld: 5.9 % — ABNORMAL HIGH (ref 4.8–5.6)

## 2019-08-30 LAB — LIPID PANEL
Chol/HDL Ratio: 2.4 ratio (ref 0.0–4.4)
Cholesterol, Total: 162 mg/dL (ref 100–199)
HDL: 68 mg/dL (ref >39)
LDL Chol Calc (NIH): 83 mg/dL (ref 0–99)
Triglycerides: 51 mg/dL (ref 0–149)
VLDL Cholesterol Cal: 11 mg/dL (ref 5–40)

## 2019-08-30 LAB — CBC WITH DIFFERENTIAL/PLATELET
Basophils Absolute: 0 10*3/uL (ref 0.0–0.2)
Basos: 1 %
EOS (ABSOLUTE): 0.1 10*3/uL (ref 0.0–0.4)
Eos: 2 %
Hematocrit: 36.8 % (ref 34.0–46.6)
Hemoglobin: 11.4 g/dL (ref 11.1–15.9)
Immature Grans (Abs): 0 10*3/uL (ref 0.0–0.1)
Immature Granulocytes: 0 %
Lymphocytes Absolute: 1.4 10*3/uL (ref 0.7–3.1)
Lymphs: 38 %
MCH: 23.9 pg — ABNORMAL LOW (ref 26.6–33.0)
MCHC: 31 g/dL — ABNORMAL LOW (ref 31.5–35.7)
MCV: 77 fL — ABNORMAL LOW (ref 79–97)
Monocytes Absolute: 0.3 10*3/uL (ref 0.1–0.9)
Monocytes: 8 %
Neutrophils Absolute: 2 10*3/uL (ref 1.4–7.0)
Neutrophils: 51 %
Platelets: 107 10*3/uL — ABNORMAL LOW (ref 150–450)
RBC: 4.77 x10E6/uL (ref 3.77–5.28)
RDW: 13.7 % (ref 11.7–15.4)
WBC: 3.8 10*3/uL (ref 3.4–10.8)

## 2019-08-30 LAB — TSH: TSH: 1.81 u[IU]/mL (ref 0.450–4.500)

## 2019-09-02 ENCOUNTER — Ambulatory Visit (INDEPENDENT_AMBULATORY_CARE_PROVIDER_SITE_OTHER): Payer: 59 | Admitting: Family Medicine

## 2019-09-02 ENCOUNTER — Encounter: Payer: Self-pay | Admitting: Family Medicine

## 2019-09-02 ENCOUNTER — Other Ambulatory Visit: Payer: Self-pay

## 2019-09-02 VITALS — BP 130/84 | HR 52 | Temp 97.4°F | Ht 66.0 in | Wt 167.3 lb

## 2019-09-02 DIAGNOSIS — R7309 Other abnormal glucose: Secondary | ICD-10-CM | POA: Diagnosis not present

## 2019-09-02 DIAGNOSIS — E7800 Pure hypercholesterolemia, unspecified: Secondary | ICD-10-CM | POA: Diagnosis not present

## 2019-09-02 DIAGNOSIS — Z0000 Encounter for general adult medical examination without abnormal findings: Secondary | ICD-10-CM

## 2019-09-02 DIAGNOSIS — D509 Iron deficiency anemia, unspecified: Secondary | ICD-10-CM

## 2019-09-02 DIAGNOSIS — D696 Thrombocytopenia, unspecified: Secondary | ICD-10-CM

## 2019-09-02 DIAGNOSIS — Z1159 Encounter for screening for other viral diseases: Secondary | ICD-10-CM | POA: Insufficient documentation

## 2019-09-02 MED ORDER — ATORVASTATIN CALCIUM 10 MG PO TABS: 5.0000 mg | ORAL_TABLET | Freq: Every day | ORAL | 3 refills | Status: DC

## 2019-09-02 NOTE — Patient Instructions (Addendum)
If you are interested in the shingles vaccine series (Shingrix), call your insurance or pharmacy to check on coverage and location it must be given.  If affordable - you can schedule it here or at your pharmacy depending on coverage   Please make lab appt for a month for cbc and smear review   Keep up the good work with diet/exercise

## 2019-09-02 NOTE — Assessment & Plan Note (Signed)
Lab Results  Component Value Date   HGBA1C 5.9 (H) 08/29/2019   disc imp of low glycemic diet and wt loss to prevent DM2  Very good habits

## 2019-09-02 NOTE — Progress Notes (Signed)
Subjective:    Patient ID: Leslie Gomez, female    DOB: 1965-08-30, 54 y.o.   MRN: [id number]  This visit occurred during the SARS-CoV-2 public health emergency.  Safety protocols were in place, including screening questions prior to the visit, additional usage of staff PPE, and extensive cleaning of exam room while observing appropriate contact time as indicated for disinfecting solutions.    HPI Here for health maintenance exam and to review chronic medical problems    Wt Readings from Last 3 Encounters:  09/02/19 167 lb 5 oz (75.9 kg)  08/30/18 164 lb 4 oz (74.5 kg)  08/27/17 170 lb (77.1 kg)  stable  Exercises 5 days per week -keeping that up  Weight lifting and spin and boot camp  27.00 kg/m    Pt is covid immunized  Flu shot 10/20 Tdap 3/17 Zoster status -interested in shingrix vaccine    Colonoscopy 5/17 with 5 y recall  Mammogram 6/21 Self breast exam -no lumps   Pap 7/19 -neg with neg HPV No new partners  No period in over a year   Neg HIV screen in the past    Hyperlipidemia Lab Results  Component Value Date   CHOL 162 08/29/2019   CHOL 166 08/26/2018   CHOL 151 08/09/2017   Lab Results  Component Value Date   HDL 68 08/29/2019   HDL 76 08/26/2018   HDL 68 08/09/2017   Lab Results  Component Value Date   LDLCALC 83 08/29/2019   LDLCALC 76 08/26/2018   LDLCALC 65 08/09/2017   Lab Results  Component Value Date   TRIG 51 08/29/2019   TRIG 70 08/26/2018   TRIG 88 08/09/2017   Lab Results  Component Value Date   CHOLHDL 2.4 08/29/2019   CHOLHDL 2.2 08/26/2018   CHOLHDL 2.2 08/09/2017   No results found for: LDLDIRECT Atorvastatin and diet  Well controlled  Is mindful about diet  Splurges on vacation    Past h/o iron def anemia Lab Results  Component Value Date   WBC 3.8 08/29/2019   HGB 11.4 08/29/2019   HCT 36.8 08/29/2019   MCV 77 (L) 08/29/2019   PLT 107 (L) 08/29/2019   Low platelet ct She takes one iron pill  every other day  No bleeding or bruising  Anemic since teen   Mother had to have iron infusion -runs in the family    Interested in hep C screen   Past elevated glucose This time 97 Lab Results  Component Value Date   HGBA1C 5.9 (H) 08/29/2019  was 6.0 a year ago    Depression screen - 0   occ knee bothers her R side   Patient Active Problem List   Diagnosis Date Noted  . Thrombocytopenia ([place]) 09/02/2019  . Encounter for hepatitis C screening test for low risk patient 09/02/2019  . Elevated glucose 08/27/2017  . Colon cancer screening 05/18/2015  . Screening for HIV (human immunodeficiency virus) 05/13/2014  . Encounter for routine gynecological examination 01/17/2011  . GERD (gastroesophageal reflux disease) 01/17/2011  . Routine general medical examination at a health care facility 01/09/2011  . Other screening mammogram 01/04/2011  . HYPERCHOLESTEROLEMIA 11/19/2006  . Anemia 11/19/2006   Past Medical History:  Diagnosis Date  . Anemia    NOS since childhood runs in family  . Hyperlipidemia    Past Surgical History:  Procedure Laterality Date  . ABDOMINOPLASTY  2018  . CESAREAN SECTION    . WISDOM TOOTH EXTRACTION  Social History   Tobacco Use  . Smoking status: Never Smoker  . Smokeless tobacco: Never Used  Substance Use Topics  . Alcohol use: No    Alcohol/week: 0.0 standard drinks  . Drug use: No   Family History  Problem Relation Age of Onset  . Cancer Father        stomach CA  . Hyperlipidemia Mother   . Hypertension Mother   . Anemia Mother   . Colon cancer Neg Hx   . Breast cancer Neg Hx    No Known Allergies Current Outpatient Medications on File Prior to Visit  Medication Sig Dispense Refill  . ferrous sulfate 325 (65 FE) MG tablet 1 pill by mouth every other day 1 tablet 0   No current facility-administered medications on file prior to visit.    Review of Systems  Constitutional: Negative for activity change, appetite change,  fatigue, fever and unexpected weight change.  HENT: Negative for congestion, ear pain, rhinorrhea, sinus pressure and sore throat.   Eyes: Negative for pain, redness and visual disturbance.  Respiratory: Negative for cough, shortness of breath and wheezing.   Cardiovascular: Negative for chest pain and palpitations.  Gastrointestinal: Negative for abdominal pain, blood in stool, constipation and diarrhea.  Endocrine: Negative for polydipsia and polyuria.  Genitourinary: Negative for dysuria, frequency and urgency.  Musculoskeletal: Positive for arthralgias. Negative for back pain and myalgias.  Skin: Negative for pallor and rash.  Allergic/Immunologic: Negative for environmental allergies.  Neurological: Negative for dizziness, syncope and headaches.  Hematological: Negative for adenopathy. Does not bruise/bleed easily.  Psychiatric/Behavioral: Negative for decreased concentration and dysphoric mood. The patient is not nervous/anxious.        Objective:   Physical Exam Constitutional:      General: She is not in acute distress.    Appearance: Normal appearance. She is well-developed and normal weight. She is not ill-appearing or diaphoretic.  HENT:     Head: Normocephalic and atraumatic.     Right Ear: Tympanic membrane, ear canal and external ear normal.     Left Ear: Tympanic membrane, ear canal and external ear normal.     Nose: Nose normal. No congestion.     Mouth/Throat:     Mouth: Mucous membranes are moist.     Pharynx: Oropharynx is clear. No posterior oropharyngeal erythema.  Eyes:     General: No scleral icterus.    Extraocular Movements: Extraocular movements intact.     Conjunctiva/sclera: Conjunctivae normal.     Pupils: Pupils are equal, round, and reactive to light.  Neck:     Thyroid: No thyromegaly.     Vascular: No carotid bruit or JVD.  Cardiovascular:     Rate and Rhythm: Normal rate and regular rhythm.     Pulses: Normal pulses.     Heart sounds: Normal  heart sounds. No gallop.   Pulmonary:     Effort: Pulmonary effort is normal. No respiratory distress.     Breath sounds: Normal breath sounds. No wheezing.     Comments: Good air exch Chest:     Chest wall: No tenderness.  Abdominal:     General: Bowel sounds are normal. There is no distension or abdominal bruit.     Palpations: Abdomen is soft. There is no mass.     Tenderness: There is no abdominal tenderness.     Hernia: No hernia is present.  Genitourinary:    Comments: Breast exam: No mass, nodules, thickening, tenderness, bulging, retraction, inflamation, nipple discharge  or skin changes noted.  No axillary or clavicular LA.     Musculoskeletal:        General: No tenderness. Normal range of motion.     Cervical back: Normal range of motion and neck supple. No rigidity. No muscular tenderness.     Right lower leg: No edema.     Left lower leg: No edema.     Comments: No knee swelling  Lymphadenopathy:     Cervical: No cervical adenopathy.  Skin:    General: Skin is warm and dry.     Coloration: Skin is not jaundiced or pale.     Findings: No erythema or rash.     Comments: Few lentigines  Neurological:     Mental Status: She is alert. Mental status is at baseline.     Cranial Nerves: No cranial nerve deficit.     Motor: No abnormal muscle tone.     Coordination: Coordination normal.     Gait: Gait normal.     Deep Tendon Reflexes: Reflexes are normal and symmetric. Reflexes normal.  Psychiatric:        Mood and Affect: Mood normal.        Cognition and Memory: Cognition and memory normal.           Assessment & Plan:   Problem List Items Addressed This Visit      Other   HYPERCHOLESTEROLEMIA    Disc goals for lipids and reasons to control them Rev last labs with pt Rev low sat fat diet in detail Good control with atorvastatin and diet        Relevant Medications   atorvastatin (LIPITOR) 10 MG tablet   Anemia    Stable with qod iron  Now low  platelets  Will repeat lab in 1 mo cbc with ferritin and iron and path rev   No symptoms  No longer menstruating       Relevant Orders   CBC with Differential/Platelet   Ferritin   Iron   Pathologist smear review   Routine general medical examination at a health care facility - Primary    Reviewed health habits including diet and exercise and skin cancer prevention Reviewed appropriate screening tests for age  Also reviewed health mt list, fam hx and immunization status , as well as social and family history   See HPI Labs reviewed Neg depression screen Good health habits Ordered hep c screen for next draw Disc shingrix vaccine-will get if covered  Immunized for covid-will call with dates for chart  Good cholesterol profile        Elevated glucose    Lab Results  Component Value Date   HGBA1C 5.9 (H) 08/29/2019   disc imp of low glycemic diet and wt loss to prevent DM2  Very good habits       Thrombocytopenia ([facility])    Pl ct 107 This is new  H/o iron def since teen as well-taking iron  No bleed/bruise or other symptoms  Will re check in a mo with path rev      Relevant Orders   CBC with Differential/Platelet   Pathologist smear review   Encounter for hepatitis C screening test for low risk patient    Will check hep C screen with next lab in a mo      Relevant Orders   Hepatitis C antibody

## 2019-09-02 NOTE — Assessment & Plan Note (Signed)
Stable with qod iron  Now low platelets  Will repeat lab in 1 mo cbc with ferritin and iron and path rev   No symptoms  No longer menstruating

## 2019-09-02 NOTE — Assessment & Plan Note (Signed)
Pl ct 107 This is new  H/o iron def since teen as well-taking iron  No bleed/bruise or other symptoms  Will re check in a mo with path rev

## 2019-09-02 NOTE — Assessment & Plan Note (Signed)
Disc goals for lipids and reasons to control them Rev last labs with pt Rev low sat fat diet in detail Good control with atorvastatin and diet

## 2019-09-02 NOTE — Assessment & Plan Note (Signed)
Reviewed health habits including diet and exercise and skin cancer prevention Reviewed appropriate screening tests for age  Also reviewed health mt list, fam hx and immunization status , as well as social and family history   See HPI Labs reviewed Neg depression screen Good health habits Ordered hep c screen for next draw Disc shingrix vaccine-will get if covered  Immunized for covid-will call with dates for chart  Good cholesterol profile

## 2019-09-02 NOTE — Assessment & Plan Note (Signed)
Will check hep C screen with next lab in a mo

## 2019-10-06 ENCOUNTER — Other Ambulatory Visit: Payer: Self-pay

## 2019-10-06 ENCOUNTER — Other Ambulatory Visit (INDEPENDENT_AMBULATORY_CARE_PROVIDER_SITE_OTHER): Payer: 59

## 2019-10-06 DIAGNOSIS — D509 Iron deficiency anemia, unspecified: Secondary | ICD-10-CM | POA: Diagnosis not present

## 2019-10-06 DIAGNOSIS — Z1159 Encounter for screening for other viral diseases: Secondary | ICD-10-CM

## 2019-10-06 DIAGNOSIS — D696 Thrombocytopenia, unspecified: Secondary | ICD-10-CM

## 2019-10-07 LAB — CBC WITH DIFFERENTIAL/PLATELET
Basophils Absolute: 0 10*3/uL (ref 0.0–0.2)
Basos: 0 %
EOS (ABSOLUTE): 0 10*3/uL (ref 0.0–0.4)
Eos: 0 %
Hematocrit: 38.7 % (ref 34.0–46.6)
Hemoglobin: 12 g/dL (ref 11.1–15.9)
Immature Grans (Abs): 0 10*3/uL (ref 0.0–0.1)
Immature Granulocytes: 0 %
Lymphocytes Absolute: 1.9 10*3/uL (ref 0.7–3.1)
Lymphs: 33 %
MCH: 24.1 pg — ABNORMAL LOW (ref 26.6–33.0)
MCHC: 31 g/dL — ABNORMAL LOW (ref 31.5–35.7)
MCV: 78 fL — ABNORMAL LOW (ref 79–97)
Monocytes Absolute: 0.3 10*3/uL (ref 0.1–0.9)
Monocytes: 6 %
Neutrophils Absolute: 3.5 10*3/uL (ref 1.4–7.0)
Neutrophils: 61 %
Platelets: 111 10*3/uL — ABNORMAL LOW (ref 150–450)
RBC: 4.98 x10E6/uL (ref 3.77–5.28)
RDW: 12.9 % (ref 11.7–15.4)
WBC: 5.7 10*3/uL (ref 3.4–10.8)

## 2019-10-07 LAB — FERRITIN: Ferritin: 171 ng/mL — ABNORMAL HIGH (ref 15–150)

## 2019-10-07 LAB — IRON: Iron: 69 ug/dL (ref 27–159)

## 2019-10-07 LAB — HEPATITIS C ANTIBODY: Hep C Virus Ab: 0.2 s/co ratio (ref 0.0–0.9)

## 2019-10-09 LAB — PATHOLOGIST SMEAR REVIEW
Basophils Absolute: 0 10*3/uL (ref 0.0–0.2)
Basos: 0 %
EOS (ABSOLUTE): 0 10*3/uL (ref 0.0–0.4)
Eos: 0 %
Hematocrit: 39.1 % (ref 34.0–46.6)
Hemoglobin: 11.9 g/dL (ref 11.1–15.9)
Immature Grans (Abs): 0 10*3/uL (ref 0.0–0.1)
Immature Granulocytes: 0 %
Lymphocytes Absolute: 1.9 10*3/uL (ref 0.7–3.1)
Lymphs: 33 %
MCH: 23.7 pg — ABNORMAL LOW (ref 26.6–33.0)
MCHC: 30.4 g/dL — ABNORMAL LOW (ref 31.5–35.7)
MCV: 78 fL — ABNORMAL LOW (ref 79–97)
Monocytes Absolute: 0.3 10*3/uL (ref 0.1–0.9)
Monocytes: 6 %
Neutrophils Absolute: 3.5 10*3/uL (ref 1.4–7.0)
Neutrophils: 61 %
Platelets: 114 10*3/uL — ABNORMAL LOW (ref 150–450)
RBC: 5.03 x10E6/uL (ref 3.77–5.28)
RDW: 13.3 % (ref 11.7–15.4)
WBC: 5.7 10*3/uL (ref 3.4–10.8)

## 2019-10-20 ENCOUNTER — Other Ambulatory Visit: Payer: Self-pay

## 2019-10-20 ENCOUNTER — Encounter: Payer: Self-pay | Admitting: Family Medicine

## 2019-10-20 ENCOUNTER — Ambulatory Visit (INDEPENDENT_AMBULATORY_CARE_PROVIDER_SITE_OTHER)
Admission: RE | Admit: 2019-10-20 | Discharge: 2019-10-20 | Disposition: A | Discharge status: Home / Self Care | Payer: 59 | Source: Ambulatory Visit | Attending: Family Medicine | Admitting: Family Medicine

## 2019-10-20 ENCOUNTER — Ambulatory Visit: Payer: 59 | Admitting: Family Medicine

## 2019-10-20 VITALS — BP 140/80 | HR 57 | Temp 98.5°F | Ht 66.0 in | Wt 165.5 lb

## 2019-10-20 DIAGNOSIS — M25561 Pain in right knee: Secondary | ICD-10-CM | POA: Diagnosis not present

## 2019-10-20 DIAGNOSIS — M2241 Chondromalacia patellae, right knee: Secondary | ICD-10-CM

## 2019-10-20 MED ORDER — DICLOFENAC SODIUM 75 MG PO TBEC
75.0000 mg | DELAYED_RELEASE_TABLET | Freq: Two times a day (BID) | ORAL | 1 refills | Status: AC
Start: 2019-10-20 — End: 2020-10-19

## 2019-10-20 NOTE — Patient Instructions (Signed)
Diclofenac 1 tablet twice a day  Hold off on impact x 2 weeks  OK to spin and modify [employer], running, and loading of the knee - at least 2 weeks. But go back very slowly

## 2019-10-20 NOTE — Progress Notes (Signed)
Leslie Albrecht T. Wylodean Shimmel, MD, [place] at [facility] Leslie Gomez, [id number]  Phone: [phone]  FAX: [name] - 54 y.o. female  MRN [id number]  Date of Birth: 1965-03-07  Date: 10/20/2019  PCP: Abner Greenspan, MD  Referral: Abner Greenspan, MD  Chief Complaint  Patient presents with  . Knee Pain    Right    This visit occurred during the SARS-CoV-2 public health emergency.  Safety protocols were in place, including screening questions prior to the visit, additional usage of staff PPE, and extensive cleaning of exam room while observing appropriate contact time as indicated for disinfecting solutions.   Subjective:   Leslie Gomez is a 54 y.o. very pleasant female patient with Body mass index is 26.71 kg/m. who presents with the following:  R sided knee pain:  Has been hurting the last couple of months.  Has used some ice.  Shower, icy hot.    No truma.   She is a pleasant young lady at age 72 who presents with some right-sided knee pain that has been worse over the last 2 months.  She is normally quite active and she does body pump, spinning, as well as some dancing and [employer].  Her pain is primarily anterior.  She has not noticed any swelling and she is not in any kind of specific injury that she can recall.  She is not had any prior significant trauma or fractures or dislocations in the affected joint.  At this point she is not tried it all that much to see if it would get better.  She has done some modification, but basically she is continued most of her activity in the gym.  She does have some aching afterwards.  She also normally will run sometimes.  Review of Systems is noted in the HPI, as appropriate   Objective:   BP 140/80   Pulse (!) 57   Temp 98.5 F (36.9 C) (Temporal)   Ht 5\' 6"  (1.676 m)   Wt 165 lb 8 oz (75.1 kg)   SpO2 98%    BMI 26.71 kg/m    GEN: No acute distress; alert,appropriate. PULM: Breathing comfortably in no respiratory distress PSYCH: Normally interactive.   Knee: Right Gait: Normal heel toe pattern, mild limp ROM: Full extension and flexion to 125 Effusion: Minimal  Echymosis or edema: none Patellar tendon NT Painful PLICA: neg Patellar grind: negative Medial and lateral patellar facet loading: Positive medial and lateral joint lines mild medial Mcmurray's pain Flexion-pinch pain Bounce home is also positive with pain Varus and valgus stress: stable Lachman: neg Ant and Post drawer: neg Hip abduction, IR, ER: WNL Hip flexion str: 5/5 Hip abd: 5/5 Quad: 5/5 VMO atrophy:No Hamstring concentric and eccentric: 5/5   Radiology: X-rays: AP Bilateral Weight-bearing, Weightbearing Lateral, Sunrise views, Lutricia Feil view Indication: knee pain Findings: She does have some mild patellofemoral compartmental osteoarthritis with the spur and some proximal enthesopathy of the quad tendon insertion at the patella.  There is also some minimal intra-articular arthritis otherwise.  No fracture or dislocation. Electronically Signed  By: Owens Loffler, MD On: 10/20/2019  3:20 PM EDT   Assessment and Plan:     ICD-10-CM   1. Acute pain of right knee  M25.561 DG Knee 4 Views W/Patella Right  2. Chondromalacia, patella, right  M22.41    Hopefully this is just a acute  injury secondary to overuse and a highly active patient.  She does have some patellofemoral compartment osteoarthritis on her x-ray, and this would contribute to this as well.  I am going to have her do some activity modification for the next 2 to 3 weeks with scheduled anti-inflammatories.  She should be able to scan and modify in her body pump classes and avoid impact activities including her dance classes as well as running.  Follow-up: No follow-ups on file.  Meds ordered this encounter  Medications  . diclofenac (VOLTAREN) 75 MG  EC tablet    Sig: Take 1 tablet (75 mg total) by mouth 2 (two) times daily.    Dispense:  60 tablet    Refill:  1   There are no discontinued medications. Orders Placed This Encounter  Procedures  . DG Knee 4 Views W/Patella Right    Signed,  Frederico Hamman T. Solaris Kram, MD   Outpatient Encounter Medications as of 10/20/2019  Medication Sig  . atorvastatin (LIPITOR) 10 MG tablet Take 0.5 tablets (5 mg total) by mouth daily.  . ferrous sulfate 325 (65 FE) MG tablet 1 pill by mouth every other day  . diclofenac (VOLTAREN) 75 MG EC tablet Take 1 tablet (75 mg total) by mouth 2 (two) times daily.   No facility-administered encounter medications on file as of 10/20/2019.

## 2020-05-05 ENCOUNTER — Ambulatory Visit: Payer: 59 | Admitting: Family Medicine

## 2020-07-27 ENCOUNTER — Other Ambulatory Visit: Payer: Self-pay | Admitting: Family Medicine

## 2020-07-27 DIAGNOSIS — Z1231 Encounter for screening mammogram for malignant neoplasm of breast: Secondary | ICD-10-CM

## 2020-07-29 ENCOUNTER — Ambulatory Visit
Admission: RE | Admit: 2020-07-29 | Discharge: 2020-07-29 | Disposition: A | Discharge status: Home / Self Care | Payer: 59 | Source: Ambulatory Visit | Attending: Family Medicine | Admitting: Family Medicine

## 2020-07-29 ENCOUNTER — Other Ambulatory Visit: Payer: Self-pay

## 2020-07-29 DIAGNOSIS — Z1231 Encounter for screening mammogram for malignant neoplasm of breast: Secondary | ICD-10-CM

## 2020-08-04 ENCOUNTER — Ambulatory Visit: Payer: 59 | Admitting: Family Medicine

## 2020-08-25 ENCOUNTER — Telehealth: Payer: Self-pay | Admitting: Family Medicine

## 2020-08-25 DIAGNOSIS — R7309 Other abnormal glucose: Secondary | ICD-10-CM

## 2020-08-25 DIAGNOSIS — Z0000 Encounter for general adult medical examination without abnormal findings: Secondary | ICD-10-CM

## 2020-08-25 DIAGNOSIS — E7800 Pure hypercholesterolemia, unspecified: Secondary | ICD-10-CM

## 2020-08-25 NOTE — Telephone Encounter (Signed)
-----   Message from Ellamae Sia sent at 08/09/2020 11:51 AM EDT ----- Regarding: Lab orders for Thursday 7.7.22 Patient is scheduled for CPX labs, please order future labs, Thanks , Karna Christmas

## 2020-08-26 ENCOUNTER — Other Ambulatory Visit: Payer: 59

## 2020-09-02 ENCOUNTER — Telehealth: Payer: Self-pay | Admitting: Family Medicine

## 2020-09-02 DIAGNOSIS — Z0000 Encounter for general adult medical examination without abnormal findings: Secondary | ICD-10-CM

## 2020-09-02 DIAGNOSIS — D509 Iron deficiency anemia, unspecified: Secondary | ICD-10-CM

## 2020-09-02 DIAGNOSIS — D696 Thrombocytopenia, unspecified: Secondary | ICD-10-CM

## 2020-09-02 DIAGNOSIS — R7309 Other abnormal glucose: Secondary | ICD-10-CM

## 2020-09-02 DIAGNOSIS — E7800 Pure hypercholesterolemia, unspecified: Secondary | ICD-10-CM

## 2020-09-02 NOTE — Telephone Encounter (Signed)
-----   Message from Cloyd Stagers, RT sent at 08/16/2020 11:05 AM EDT ----- Regarding: Lab Orders for Friday 7.15.2022 Please place lab orders for Friday 7.15.2022, office visit for physical on Tuesday 7.19.2022 Thank you, Dyke Maes RT(R)

## 2020-09-03 ENCOUNTER — Other Ambulatory Visit: Payer: Self-pay

## 2020-09-03 ENCOUNTER — Other Ambulatory Visit (INDEPENDENT_AMBULATORY_CARE_PROVIDER_SITE_OTHER): Payer: 59

## 2020-09-03 ENCOUNTER — Encounter: Payer: 59 | Admitting: Family Medicine

## 2020-09-03 DIAGNOSIS — R7309 Other abnormal glucose: Secondary | ICD-10-CM

## 2020-09-03 DIAGNOSIS — Z0000 Encounter for general adult medical examination without abnormal findings: Secondary | ICD-10-CM

## 2020-09-03 DIAGNOSIS — D696 Thrombocytopenia, unspecified: Secondary | ICD-10-CM

## 2020-09-03 DIAGNOSIS — D509 Iron deficiency anemia, unspecified: Secondary | ICD-10-CM

## 2020-09-03 DIAGNOSIS — E7800 Pure hypercholesterolemia, unspecified: Secondary | ICD-10-CM

## 2020-09-04 LAB — COMPREHENSIVE METABOLIC PANEL
ALT: 8 IU/L (ref 0–32)
AST: 22 IU/L (ref 0–40)
Albumin/Globulin Ratio: 1.8 (ref 1.2–2.2)
Albumin: 4.7 g/dL (ref 3.8–4.9)
Alkaline Phosphatase: 67 IU/L (ref 44–121)
BUN/Creatinine Ratio: 11 (ref 9–23)
BUN: 10 mg/dL (ref 6–24)
Bilirubin Total: 0.7 mg/dL (ref 0.0–1.2)
CO2: 26 mmol/L (ref 20–29)
Calcium: 9.7 mg/dL (ref 8.7–10.2)
Chloride: 103 mmol/L (ref 96–106)
Creatinine, Ser: 0.94 mg/dL (ref 0.57–1.00)
Globulin, Total: 2.6 g/dL (ref 1.5–4.5)
Glucose: 112 mg/dL — ABNORMAL HIGH (ref 65–99)
Potassium: 4.4 mmol/L (ref 3.5–5.2)
Sodium: 141 mmol/L (ref 134–144)
Total Protein: 7.3 g/dL (ref 6.0–8.5)
eGFR: 72 mL/min/{1.73_m2} (ref >59)

## 2020-09-04 LAB — CBC WITH DIFFERENTIAL/PLATELET
Basophils Absolute: 0 10*3/uL (ref 0.0–0.2)
Basos: 1 %
EOS (ABSOLUTE): 0.1 10*3/uL (ref 0.0–0.4)
Eos: 2 %
Hematocrit: 34.9 % (ref 34.0–46.6)
Hemoglobin: 11 g/dL — ABNORMAL LOW (ref 11.1–15.9)
Immature Grans (Abs): 0 10*3/uL (ref 0.0–0.1)
Immature Granulocytes: 0 %
Lymphocytes Absolute: 1.7 10*3/uL (ref 0.7–3.1)
Lymphs: 37 %
MCH: 23.6 pg — ABNORMAL LOW (ref 26.6–33.0)
MCHC: 31.5 g/dL (ref 31.5–35.7)
MCV: 75 fL — ABNORMAL LOW (ref 79–97)
Monocytes Absolute: 0.4 10*3/uL (ref 0.1–0.9)
Monocytes: 8 %
Neutrophils Absolute: 2.3 10*3/uL (ref 1.4–7.0)
Neutrophils: 52 %
Platelets: 121 10*3/uL — ABNORMAL LOW (ref 150–450)
RBC: 4.67 x10E6/uL (ref 3.77–5.28)
RDW: 13.6 % (ref 11.7–15.4)
WBC: 4.4 10*3/uL (ref 3.4–10.8)

## 2020-09-04 LAB — LIPID PANEL
Chol/HDL Ratio: 3 ratio (ref 0.0–4.4)
Cholesterol, Total: 178 mg/dL (ref 100–199)
HDL: 60 mg/dL (ref >39)
LDL Chol Calc (NIH): 105 mg/dL — ABNORMAL HIGH (ref 0–99)
Triglycerides: 71 mg/dL (ref 0–149)
VLDL Cholesterol Cal: 13 mg/dL (ref 5–40)

## 2020-09-04 LAB — HEMOGLOBIN A1C
Est. average glucose Bld gHb Est-mCnc: 126 mg/dL
Hgb A1c MFr Bld: 6 % — ABNORMAL HIGH (ref 4.8–5.6)

## 2020-09-04 LAB — IRON: Iron: 59 ug/dL (ref 27–159)

## 2020-09-04 LAB — FERRITIN: Ferritin: 201 ng/mL — ABNORMAL HIGH (ref 15–150)

## 2020-09-04 LAB — TSH: TSH: 1.38 u[IU]/mL (ref 0.450–4.500)

## 2020-09-07 ENCOUNTER — Other Ambulatory Visit: Payer: Self-pay

## 2020-09-07 ENCOUNTER — Encounter: Payer: Self-pay | Admitting: Family Medicine

## 2020-09-07 ENCOUNTER — Ambulatory Visit (INDEPENDENT_AMBULATORY_CARE_PROVIDER_SITE_OTHER): Payer: 59 | Admitting: Family Medicine

## 2020-09-07 VITALS — BP 148/90 | HR 57 | Temp 98.2°F | Ht 66.0 in | Wt 172.3 lb

## 2020-09-07 DIAGNOSIS — R7309 Other abnormal glucose: Secondary | ICD-10-CM

## 2020-09-07 DIAGNOSIS — D509 Iron deficiency anemia, unspecified: Secondary | ICD-10-CM | POA: Diagnosis not present

## 2020-09-07 DIAGNOSIS — Z0000 Encounter for general adult medical examination without abnormal findings: Secondary | ICD-10-CM

## 2020-09-07 DIAGNOSIS — D696 Thrombocytopenia, unspecified: Secondary | ICD-10-CM

## 2020-09-07 DIAGNOSIS — E7800 Pure hypercholesterolemia, unspecified: Secondary | ICD-10-CM

## 2020-09-07 DIAGNOSIS — Z1211 Encounter for screening for malignant neoplasm of colon: Secondary | ICD-10-CM

## 2020-09-07 MED ORDER — ATORVASTATIN CALCIUM 10 MG PO TABS: 5.0000 mg | ORAL_TABLET | Freq: Every day | ORAL | 3 refills | Status: DC

## 2020-09-07 NOTE — Progress Notes (Signed)
Subjective:    Patient ID: Leslie Gomez, female    DOB: December 13, 1965, 55 y.o.   MRN: [id number]  This visit occurred during the SARS-CoV-2 public health emergency.  Safety protocols were in place, including screening questions prior to the visit, additional usage of staff PPE, and extensive cleaning of exam room while observing appropriate contact time as indicated for disinfecting solutions.   HPI Here for health maintenance exam and to review chronic medical problems    Wt Readings from Last 3 Encounters:  09/07/20 172 lb 5 oz (78.2 kg)  10/20/19 165 lb 8 oz (75.1 kg)  09/02/19 167 lb 5 oz (75.9 kg)   27.81 kg/m Not doing a lot lately  Fair self care  Hurt shoulder-has not been working out for several months  Started in early may  It feels better  ? Overuse injury  She made an appt with Dr Lorelei Pont but cancelled because she gets better   Back to walking now  Eating healthy   Zoster status -interested in shingrix   Covid immunized  Flu shot - usually in the fall  Tdap 3/17   Colonoscopy 5/17 with 5 y recall  She wants to schedule it herself -does not desire a referral   Mammogram 6/22 Self breast exam -no lumps   Pap 7/19 neg with neg HPV No period  Stopped at 50  Not a lot of hot flashes  No gyn issues at all   BP Readings from Last 3 Encounters:  09/07/20 (!) 148/90  10/20/19 140/80  09/02/19 130/84  Bp up- was running to get here/lower out of the office Pulse Readings from Last 3 Encounters:  09/07/20 (!) 57  10/20/19 (!) 57  09/02/19 (!) 52    Iron deficiency and h/o this in family  Also low platelets  Malabsorbtion  Takes ferrous sulfate 325 mg every other day  Lab Results  Component Value Date   WBC 4.4 09/03/2020   HGB 11.0 (L) 09/03/2020   HCT 34.9 09/03/2020   MCV 75 (L) 09/03/2020   PLT 121 (L) 09/03/2020   Lab Results  Component Value Date   IRON 59 09/03/2020   FERRITIN 201 (H) 09/03/2020  Eats a balanced diet     Elevated glucose in the past Lab Results  Component Value Date   HGBA1C 6.0 (H) 09/03/2020  Last time 5.9 a yar ago   Hyperlipidemia Lab Results  Component Value Date   CHOL 178 09/03/2020   CHOL 162 08/29/2019   CHOL 166 08/26/2018   Lab Results  Component Value Date   HDL 60 09/03/2020   HDL 68 08/29/2019   HDL 76 08/26/2018   Lab Results  Component Value Date   LDLCALC 105 (H) 09/03/2020   LDLCALC 83 08/29/2019   LDLCALC 76 08/26/2018   Lab Results  Component Value Date   TRIG 71 09/03/2020   TRIG 51 08/29/2019   TRIG 70 08/26/2018   Lab Results  Component Value Date   CHOLHDL 3.0 09/03/2020   CHOLHDL 2.4 08/29/2019   CHOLHDL 2.2 08/26/2018   No results found for: LDLDIRECT Takes atorvastatin 5 mg daily   Eating more eggs  More red meat More shellfish   Patient Active Problem List   Diagnosis Date Noted   Thrombocytopenia ([place]) 09/02/2019   Encounter for hepatitis C screening test for low risk patient 09/02/2019   Elevated glucose 08/27/2017   Colon cancer screening 05/18/2015   Screening for HIV (human immunodeficiency virus) 05/13/2014  Encounter for routine gynecological examination 01/17/2011   GERD (gastroesophageal reflux disease) 01/17/2011   Routine general medical examination at a health care facility 01/09/2011   Other screening mammogram 01/04/2011   HYPERCHOLESTEROLEMIA 11/19/2006   Anemia 11/19/2006   Past Medical History:  Diagnosis Date   Anemia    NOS since childhood runs in family   Hyperlipidemia    Past Surgical History:  Procedure Laterality Date   ABDOMINOPLASTY  2018   CESAREAN SECTION     WISDOM TOOTH EXTRACTION     Social History   Tobacco Use   Smoking status: Never   Smokeless tobacco: Never  Substance Use Topics   Alcohol use: No    Alcohol/week: 0.0 standard drinks   Drug use: No   Family History  Problem Relation Age of Onset   Cancer Father        stomach CA   Hyperlipidemia Mother     Hypertension Mother    Anemia Mother    Colon cancer Neg Hx    Breast cancer Neg Hx    No Known Allergies Current Outpatient Medications on File Prior to Visit  Medication Sig Dispense Refill   diclofenac (VOLTAREN) 75 MG EC tablet Take 1 tablet (75 mg total) by mouth 2 (two) times daily. 60 tablet 1   ferrous sulfate 325 (65 FE) MG tablet 1 pill by mouth every other day 1 tablet 0   No current facility-administered medications on file prior to visit.     Review of Systems  Constitutional:  Negative for activity change, appetite change, fatigue, fever and unexpected weight change.  HENT:  Negative for congestion, ear pain, rhinorrhea, sinus pressure and sore throat.   Eyes:  Negative for pain, redness and visual disturbance.  Respiratory:  Negative for cough, shortness of breath and wheezing.   Cardiovascular:  Negative for chest pain and palpitations.  Gastrointestinal:  Negative for abdominal pain, blood in stool, constipation and diarrhea.  Endocrine: Negative for polydipsia and polyuria.  Genitourinary:  Negative for dysuria, frequency and urgency.  Musculoskeletal:  Negative for arthralgias, back pain and myalgias.  Skin:  Negative for pallor and rash.  Allergic/Immunologic: Negative for environmental allergies.  Neurological:  Negative for dizziness, syncope and headaches.  Hematological:  Negative for adenopathy. Does not bruise/bleed easily.  Psychiatric/Behavioral:  Negative for decreased concentration and dysphoric mood. The patient is not nervous/anxious.       Objective:   Physical Exam Constitutional:      General: She is not in acute distress.    Appearance: Normal appearance. She is well-developed and normal weight. She is not ill-appearing or diaphoretic.  HENT:     Head: Normocephalic and atraumatic.     Right Ear: Tympanic membrane, ear canal and external ear normal.     Left Ear: Tympanic membrane, ear canal and external ear normal.     Nose: Nose normal. No  congestion.     Mouth/Throat:     Mouth: Mucous membranes are moist.     Pharynx: Oropharynx is clear. No posterior oropharyngeal erythema.  Eyes:     General: No scleral icterus.    Extraocular Movements: Extraocular movements intact.     Conjunctiva/sclera: Conjunctivae normal.     Pupils: Pupils are equal, round, and reactive to light.  Neck:     Thyroid: No thyromegaly.     Vascular: No carotid bruit or JVD.  Cardiovascular:     Rate and Rhythm: Normal rate and regular rhythm.     Pulses:  Normal pulses.     Heart sounds: Normal heart sounds.    No gallop.  Pulmonary:     Effort: Pulmonary effort is normal. No respiratory distress.     Breath sounds: Normal breath sounds. No wheezing.     Comments: Good air exch Chest:     Chest wall: No tenderness.  Abdominal:     General: Bowel sounds are normal. There is no distension or abdominal bruit.     Palpations: Abdomen is soft. There is no mass.     Tenderness: There is no abdominal tenderness.     Hernia: No hernia is present.  Genitourinary:    Comments: Breast exam: No mass, nodules, thickening, tenderness, bulging, retraction, inflamation, nipple discharge or skin changes noted.  No axillary or clavicular LA.     Musculoskeletal:        General: No tenderness. Normal range of motion.     Cervical back: Normal range of motion and neck supple. No rigidity. No muscular tenderness.     Right lower leg: No edema.     Left lower leg: No edema.     Comments: No kyphosis   Lymphadenopathy:     Cervical: No cervical adenopathy.  Skin:    General: Skin is warm and dry.     Coloration: Skin is not pale.     Findings: No erythema or rash.  Neurological:     Mental Status: She is alert. Mental status is at baseline.     Cranial Nerves: No cranial nerve deficit.     Motor: No abnormal muscle tone.     Coordination: Coordination normal.     Gait: Gait normal.     Deep Tendon Reflexes: Reflexes are normal and symmetric. Reflexes  normal.  Psychiatric:        Mood and Affect: Mood normal.        Cognition and Memory: Cognition and memory normal.          Assessment & Plan:   Problem List Items Addressed This Visit       Other   HYPERCHOLESTEROLEMIA    Disc goals for lipids and reasons to control them Rev last labs with pt Rev low sat fat diet in detail LDL up to 105 Will work on diet and continue to monitor  Continues atorvastatin 10 mg daily        Relevant Medications   atorvastatin (LIPITOR) 10 MG tablet   Anemia    Hb is down slightly  Iron level nl and ferritin is high  Taking iron QOD Strong family history of iron def anemia       Routine general medical examination at a health care facility - Primary    Reviewed health habits including diet and exercise and skin cancer prevention Reviewed appropriate screening tests for age  Also reviewed health mt list, fam hx and immunization status , as well as social and family history   See HPI Labs reviewed  Discussed shingrix vaccine/will check on coverage Pt plans to schedule colonoscopy  Mammogram utd Pap utd Disc better diet for cholesterol         Colon cancer screening    Due for 5 y recall colonoscopy  Pt declines referral and wants to schedule herself       Elevated glucose    Lab Results  Component Value Date   HGBA1C 6.0 (H) 09/03/2020  disc imp of low glycemic diet and wt loss to prevent DM2  Thrombocytopenia ([place])    Platelet ct is up to 121 No bleeding or bruising  Suspect linked to anemia  Will continue to follow

## 2020-09-07 NOTE — Assessment & Plan Note (Signed)
Due for 5 y recall colonoscopy  Pt declines referral and wants to schedule herself

## 2020-09-07 NOTE — Assessment & Plan Note (Signed)
Hb is down slightly  Iron level nl and ferritin is high  Taking iron QOD Strong family history of iron def anemia

## 2020-09-07 NOTE — Assessment & Plan Note (Addendum)
Disc goals for lipids and reasons to control them Rev last labs with pt Rev low sat fat diet in detail LDL up to 105 Will work on diet and continue to monitor  Continues atorvastatin 10 mg daily

## 2020-09-07 NOTE — Assessment & Plan Note (Signed)
Lab Results  Component Value Date   HGBA1C 6.0 (H) 09/03/2020   disc imp of low glycemic diet and wt loss to prevent DM2

## 2020-09-07 NOTE — Patient Instructions (Addendum)
Make an appointment with Dr Lorelei Pont for shoulder when you are ready  Get back to walking   If you are interested in the shingles vaccine series (Shingrix), call your insurance or pharmacy to check on coverage and location it must be given.  If affordable - you can schedule it here or at your pharmacy depending on coverage   Make your colonoscopy appt Call us if you need a referral   To prevent diabetes Try to get most of your carbohydrates from produce (with the exception of white potatoes)  Eat less bread/pasta/rice/snack foods/cereals/sweets and other items from the middle of the grocery store (processed carbs) Exercise helps too   For cholesterol  Avoid red meat/ fried foods/ egg yolks/ fatty breakfast meats/ butter, cheese and high fat dairy/ and shellfish

## 2020-09-07 NOTE — Assessment & Plan Note (Signed)
Platelet ct is up to 121 No bleeding or bruising  Suspect linked to anemia  Will continue to follow

## 2020-09-07 NOTE — Assessment & Plan Note (Signed)
Reviewed health habits including diet and exercise and skin cancer prevention Reviewed appropriate screening tests for age  Also reviewed health mt list, fam hx and immunization status , as well as social and family history   See HPI Labs reviewed  Discussed shingrix vaccine/will check on coverage Pt plans to schedule colonoscopy  Mammogram utd Pap utd Disc better diet for cholesterol

## 2020-09-16 ENCOUNTER — Ambulatory Visit: Payer: 59 | Admitting: Family Medicine

## 2020-09-16 ENCOUNTER — Other Ambulatory Visit: Payer: Self-pay

## 2020-09-16 ENCOUNTER — Encounter: Payer: Self-pay | Admitting: Family Medicine

## 2020-09-16 VITALS — BP 130/80 | HR 59 | Temp 97.8°F | Ht 66.0 in | Wt 168.8 lb

## 2020-09-16 DIAGNOSIS — M7501 Adhesive capsulitis of right shoulder: Secondary | ICD-10-CM

## 2020-09-16 NOTE — Progress Notes (Signed)
Grizelda Piscopo T. Aston Lawhorn, MD, [place] [facility] [place], [address]  Phone: [phone]  FAX: [place] - 55 y.o. female  MRN [id number]  Date of Birth: 1965/08/29  Date: 09/16/2020  PCP: Abner Greenspan, MD  Referral: Abner Greenspan, MD  Chief Complaint  Patient presents with   Shoulder Pain    Right    This visit occurred during the SARS-CoV-2 public health emergency.  Safety protocols were in place, including screening questions prior to the visit, additional usage of staff PPE, and extensive cleaning of exam room while observing appropriate contact time as indicated for disinfecting solutions.   Subjective:   Leslie Gomez is a 55 y.o. very pleasant female patient with Body mass index is 27.24 kg/m. who presents with the following:  R shoulder pain: She is a very nice lady, and I have seen her previously for a injured knee.  About 3 to 4 months ago she was working out in gym and doing a chest press, then she had some pain and it been painful for quite a while.  Since then she has developed some loss of shoulder motion on the right side.  This had been getting quite a bit worse, but it does seem to been getting better very recently.  She does still have pain and loss of motion, however.  Feeling better.  Trouble sleeping on it.  She describes pain into T-shirt distribution. No other history of fracture, traumatic dislocation or other injury.  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   [facility] F/u 2 mo if needed  Review of Systems is noted in the HPI, as appropriate   Objective:   BP 130/80   Pulse (!) 59   Temp 97.8 F (36.6 C) (Temporal)   Ht '5\' 6"'$  (1.676 m)   Wt 168 lb 12 oz (76.5 kg)   LMP 04/11/2016 (Approximate)   SpO2 96%   BMI 27.24 kg/m    Shoulder: R and L Inspection: No muscle wasting or winging Ecchymosis/edema: neg  AC joint,  scapula, clavicle: NT Cervical spine: NT, full ROM Spurling's: neg ABNORMAL SIDE TESTED: Right UNLESS OTHERWISE NOTED, THE CONTRALATERAL SIDE HAS FULL RANGE OF MOTION. Abduction: 5/5, LIMITED TO 155 DEGREES Flexion: 5/5, LIMITED TO 155 DEGNO ROM  IR, lift-off: 5/5. TESTED AT 90 DEGREES OF ABDUCTION, LIMITED TO 10 DEGREES ER at neutral:  5/5, TESTED AT 90 DEGREES OF ABDUCTION, LIMITED TO 85 DEGREES AC crossover and compression: PAIN Drop Test: neg Empty Can: neg Supraspinatus insertion: NT Bicipital groove: NT ALL OTHER SPECIAL TESTING EQUIVOCAL GIVEN LOSS OF MOTION C5-T1 intact Sensation intact Grip 5/5    Radiology: No results found.  Assessment and Plan:     ICD-10-CM   1. Adhesive capsulitis of right shoulder  M75.01      This does appear to already be improving. Acute original pectoralis versus shoulder injury now with secondary frozen shoulder. Classic pain distribution and loss of motion.  Patient was given a systematic ROM protocol from [facility] to be done daily. Emphasized adherence and recommended formal PT.  Tylenol or NSAID of choice prn for pain relief  Will need RTC str and scapular stabilization to fix underlying mechanics.  Intraarticular corticosteroid injections in Adhesive Capsulitis have clearly been shown to be of benefit, and we can consider this if her symptoms do not improve in short order.  Follow-up with  me in 2 months, but if she is better, I do not think that she needs to follow-up with me.    Signed,  Maud Deed. Shanan Mcmiller, MD   Outpatient Encounter Medications as of 09/16/2020  Medication Sig   atorvastatin (LIPITOR) 10 MG tablet Take 0.5 tablets (5 mg total) by mouth daily.   diclofenac (VOLTAREN) 75 MG EC tablet Take 1 tablet (75 mg total) by mouth 2 (two) times daily.   ferrous sulfate 325 (65 FE) MG tablet 1 pill by mouth every other day   No facility-administered encounter medications on file as of 09/16/2020.

## 2020-10-28 ENCOUNTER — Encounter: Payer: Self-pay | Admitting: Gastroenterology

## 2020-11-09 ENCOUNTER — Encounter: Payer: Self-pay | Admitting: Gastroenterology

## 2020-11-09 ENCOUNTER — Other Ambulatory Visit: Payer: Self-pay | Admitting: Family Medicine

## 2020-11-09 DIAGNOSIS — Z1231 Encounter for screening mammogram for malignant neoplasm of breast: Secondary | ICD-10-CM

## 2020-11-22 ENCOUNTER — Other Ambulatory Visit: Payer: Self-pay

## 2020-11-22 ENCOUNTER — Encounter: Payer: Self-pay | Admitting: Gastroenterology

## 2020-11-22 ENCOUNTER — Ambulatory Visit (AMBULATORY_SURGERY_CENTER): Payer: Self-pay

## 2020-11-22 VITALS — Ht 66.0 in | Wt 165.0 lb

## 2020-11-22 DIAGNOSIS — Z86010 Personal history of colonic polyps: Secondary | ICD-10-CM

## 2020-11-22 MED ORDER — PEG-KCL-NACL-NASULF-NA ASC-C 100 G PO SOLR
1.0000 | Freq: Once | ORAL | 0 refills | Status: AC
Start: 2020-11-22 — End: 2020-11-22

## 2020-11-22 NOTE — Progress Notes (Signed)
Denies allergies to eggs or soy products. Denies complication of anesthesia or sedation. Denies use of weight loss medication. Denies use of O2.   Emmi instructions given for colonoscopy.  

## 2020-12-08 ENCOUNTER — Other Ambulatory Visit: Payer: Self-pay

## 2020-12-08 ENCOUNTER — Ambulatory Visit (AMBULATORY_SURGERY_CENTER): Payer: 59 | Admitting: Gastroenterology

## 2020-12-08 ENCOUNTER — Encounter: Payer: Self-pay | Admitting: Gastroenterology

## 2020-12-08 VITALS — BP 131/56 | HR 56 | Temp 98.1°F | Resp 19 | Ht 66.0 in | Wt 165.0 lb

## 2020-12-08 DIAGNOSIS — Z86010 Personal history of colonic polyps: Secondary | ICD-10-CM | POA: Diagnosis not present

## 2020-12-08 MED ORDER — SODIUM CHLORIDE 0.9 % IV SOLN: 500.0000 mL | Freq: Once | INTRAVENOUS | Status: DC

## 2020-12-08 NOTE — Progress Notes (Signed)
Pt's states no medical or surgical changes since previsit or office visit. 

## 2020-12-08 NOTE — Op Note (Signed)
[facility] Patient Name: Leslie Gomez Procedure Date: 12/08/2020 8:39 AM MRN: [id number] Endoscopist: Ladene Artist , MD Age: 55 Referring MD:  Date of Birth: [birth year] Gender: Female Account #: [id number] Procedure:                Colonoscopy Indications:              Surveillance: Personal history of adenomatous                            polyps on last colonoscopy 5 years ago Medicines:                Monitored Anesthesia Care Procedure:                Pre-Anesthesia Assessment:                           - Prior to the procedure, a History and Physical                            was performed, and patient medications and                            allergies were reviewed. The patient's tolerance of                            previous anesthesia was also reviewed. The risks                            and benefits of the procedure and the sedation                            options and risks were discussed with the patient.                            All questions were answered, and informed consent                            was obtained. Prior Anticoagulants: The patient has                            taken no previous anticoagulant or antiplatelet                            agents. ASA Grade Assessment: II - A patient with                            mild systemic disease. After reviewing the risks                            and benefits, the patient was deemed in                            satisfactory condition to undergo the procedure.  After obtaining informed consent, the colonoscope                            was passed under direct vision. Throughout the                            procedure, the patient's blood pressure, pulse, and                            oxygen saturations were monitored continuously. The                            Olympus CF-HQ190L ([id number]) Colonoscope was                            introduced through the anus  and advanced to the the                            cecum, identified by appendiceal orifice and                            ileocecal valve. The ileocecal valve, appendiceal                            orifice, and rectum were photographed. The quality                            of the bowel preparation was good. The colonoscopy                            was performed without difficulty. The patient                            tolerated the procedure well. Scope In: 8:54:57 AM Scope Out: 9:09:24 AM Scope Withdrawal Time: 0 hours 12 minutes 14 seconds  Total Procedure Duration: 0 hours 14 minutes 27 seconds  Findings:                 The perianal and digital rectal examinations were                            normal.                           A few small-mouthed diverticula were found in the                            sigmoid colon.                           The exam was otherwise without abnormality on                            direct and retroflexion views. Complications:            No immediate complications. Estimated blood loss:  None. Estimated Blood Loss:     Estimated blood loss: none. Impression:               - Mild diverticulosis in the sigmoid colon.                           - The examination was otherwise normal on direct                            and retroflexion views.                           - No specimens collected. Recommendation:           - Repeat colonoscopy in 10 years for surveillance.                           - Patient has a contact number available for                            emergencies. The signs and symptoms of potential                            delayed complications were discussed with the                            patient. Return to normal activities tomorrow.                            Written discharge instructions were provided to the                            patient.                           - High fiber diet.                            - Continue present medications. Ladene Artist, MD 12/08/2020 9:14:20 AM This report has been signed electronically.

## 2020-12-08 NOTE — Progress Notes (Signed)
Report to PACU, RN, vss, BBS= Clear.  

## 2020-12-08 NOTE — Patient Instructions (Signed)
Please read handouts provided. Continue present medications. High Fiber Diet.   YOU HAD AN ENDOSCOPIC PROCEDURE TODAY AT [facility]:   Refer to the procedure report that was given to you for any specific questions about what was found during the examination.  If the procedure report does not answer your questions, please call your gastroenterologist to clarify.  If you requested that your care partner not be given the details of your procedure findings, then the procedure report has been included in a sealed envelope for you to review at your convenience later.  YOU SHOULD EXPECT: Some feelings of bloating in the abdomen. Passage of more gas than usual.  Walking can help get rid of the air that was put into your GI tract during the procedure and reduce the bloating. If you had a lower endoscopy (such as a colonoscopy or flexible sigmoidoscopy) you may notice spotting of blood in your stool or on the toilet paper. If you underwent a bowel prep for your procedure, you may not have a normal bowel movement for a few days.  Please Note:  You might notice some irritation and congestion in your nose or some drainage.  This is from the oxygen used during your procedure.  There is no need for concern and it should clear up in a day or so.  SYMPTOMS TO REPORT IMMEDIATELY:  Following lower endoscopy (colonoscopy or flexible sigmoidoscopy):  Excessive amounts of blood in the stool  Significant tenderness or worsening of abdominal pains  Swelling of the abdomen that is new, acute  Fever of 100F or higher   For urgent or emergent issues, a gastroenterologist can be reached at any hour by calling [phone]. Do not use MyChart messaging for urgent concerns.    DIET:  We do recommend a small meal at first, but then you may proceed to your regular diet.  Drink plenty of fluids but you should avoid alcoholic beverages for 24 hours.  ACTIVITY:  You should plan to take it easy for the  rest of today and you should NOT DRIVE or use heavy machinery until tomorrow (because of the sedation medicines used during the test).    FOLLOW UP: Our staff will call the number listed on your records 48-72 hours following your procedure to check on you and address any questions or concerns that you may have regarding the information given to you following your procedure. If we do not reach you, we will leave a message.  We will attempt to reach you two times.  During this call, we will ask if you have developed any symptoms of COVID 19. If you develop any symptoms (ie: fever, flu-like symptoms, shortness of breath, cough etc.) before then, please call [phone].  If you test positive for Covid 19 in the 2 weeks post procedure, please call and report this information to [place].    If any biopsies were taken you will be contacted by phone or by letter within the next 1-3 weeks.  Please call us at [phone] if you have not heard about the biopsies in 3 weeks.    SIGNATURES/CONFIDENTIALITY: You and/or your care partner have signed paperwork which will be entered into your electronic medical record.  These signatures attest to the fact that that the information above on your After Visit Summary has been reviewed and is understood.  Full responsibility of the confidentiality of this discharge information lies with you and/or your care-partner.

## 2020-12-08 NOTE — Progress Notes (Signed)
History & Physical  Primary Care Physician:  Tower, Wynelle Fanny, MD Primary Gastroenterologist: Lucio Edward, MD  CHIEF COMPLAINT:  Personal history of colon polyps   HPI: Leslie Gomez is a 55 y.o. female with a history of adenomatous colon polyps presenting for colonoscopy.   Past Medical History:  Diagnosis Date   Anemia    NOS since childhood runs in family   Hyperlipidemia     Past Surgical History:  Procedure Laterality Date   ABDOMINOPLASTY  2018   CESAREAN SECTION     WISDOM TOOTH EXTRACTION      Prior to Admission medications   Medication Sig Start Date End Date Taking? Authorizing Provider  atorvastatin (LIPITOR) 10 MG tablet Take 0.5 tablets (5 mg total) by mouth daily. 09/07/20  Yes Tower, Wynelle Fanny, MD  ferrous sulfate 325 (65 FE) MG tablet 1 pill by mouth every other day 08/30/18  Yes Tower, Wynelle Fanny, MD    Current Outpatient Medications  Medication Sig Dispense Refill   atorvastatin (LIPITOR) 10 MG tablet Take 0.5 tablets (5 mg total) by mouth daily. 45 tablet 3   ferrous sulfate 325 (65 FE) MG tablet 1 pill by mouth every other day 1 tablet 0   Current Facility-Administered Medications  Medication Dose Route Frequency Provider Last Rate Last Admin   0.9 %  sodium chloride infusion  500 mL Intravenous Once Ladene Artist, MD        Allergies as of 12/08/2020   (No Known Allergies)    Family History  Problem Relation Age of Onset   Hyperlipidemia Mother    Hypertension Mother    Anemia Mother    Stomach cancer Father    Cancer Father        stomach CA   Colon cancer Neg Hx    Breast cancer Neg Hx    Esophageal cancer Neg Hx    Rectal cancer Neg Hx     Social History   Socioeconomic History   Marital status: Single    Spouse name: Not on file   Number of children: Not on file   Years of education: Not on file   Highest education level: Not on file  Occupational History   Not on file  Tobacco Use   Smoking status: Never    Smokeless tobacco: Never  Substance and Sexual Activity   Alcohol use: No    Alcohol/week: 0.0 standard drinks   Drug use: No   Sexual activity: Not on file  Other Topics Concern   Not on file  Social History Narrative   Not on file   Social Determinants of Health   Financial Resource Strain: Not on file  Food Insecurity: Not on file  Transportation Needs: Not on file  Physical Activity: Not on file  Stress: Not on file  Social Connections: Not on file  Intimate Partner Violence: Not on file    Review of Systems:  All systems reviewed an negative except where noted in HPI.  Gen: Denies any fever, chills, sweats, anorexia, fatigue, weakness, malaise, weight loss, and sleep disorder CV: Denies chest pain, angina, palpitations, syncope, orthopnea, PND, peripheral edema, and claudication. Resp: Denies dyspnea at rest, dyspnea with exercise, cough, sputum, wheezing, coughing up blood, and pleurisy. GI: Denies vomiting blood, jaundice, and fecal incontinence.   Denies dysphagia or odynophagia. GU : Denies urinary burning, blood in urine, urinary frequency, urinary hesitancy, nocturnal urination, and urinary incontinence. MS: Denies joint pain, limitation of movement, and swelling, stiffness,  low back pain, extremity pain. Denies muscle weakness, cramps, atrophy.  Derm: Denies rash, itching, dry skin, hives, moles, warts, or unhealing ulcers.  Psych: Denies depression, anxiety, memory loss, suicidal ideation, hallucinations, paranoia, and confusion. Heme: Denies bruising, bleeding, and enlarged lymph nodes. Neuro:  Denies any headaches, dizziness, paresthesias. Endo:  Denies any problems with DM, thyroid, adrenal function.   Physical Exam: General:  Alert, well-developed, in NAD Head:  Normocephalic and atraumatic. Eyes:  Sclera clear, no icterus.   Conjunctiva pink. Ears:  Normal auditory acuity. Mouth:  No deformity or lesions.  Neck:  Supple; no masses . Lungs:  Clear  throughout to auscultation.   No wheezes, crackles, or rhonchi. No acute distress. Heart:  Regular rate and rhythm; no murmurs. Abdomen:  Soft, nondistended, nontender. No masses, hepatomegaly. No obvious masses.  Normal bowel .    Rectal:  Deferred   Msk:  Symmetrical without gross deformities.. Pulses:  Normal pulses noted. Extremities:  Without edema. Neurologic:  Alert and  oriented x4;  grossly normal neurologically. Skin:  Intact without significant lesions or rashes. Cervical Nodes:  No significant cervical adenopathy. Psych:  Alert and cooperative. Normal mood and affect.  Impression / Plan:   Personal history of adenomatous colon polyps for colonoscopy.    This patient is appropriate for endoscopic procedures in the ambulatory setting.    Leslie Gomez. Fuller Plan  12/08/2020, 8:48 AM See Shea Evans, [facility], to contact our on call provider

## 2020-12-10 ENCOUNTER — Telehealth: Payer: Self-pay

## 2020-12-10 NOTE — Telephone Encounter (Signed)
  Follow up Call-  Call back number 12/08/2020  Post procedure Call Back phone  # [phone]  Permission to leave phone message Yes  Some recent data might be hidden     Patient questions:  Do you have a fever, pain , or abdominal swelling? No. Pain Score  0 *  Have you tolerated food without any problems? Yes.    Have you been able to return to your normal activities? Yes.    Do you have any questions about your discharge instructions: Diet   No. Medications  No. Follow up visit  No.  Do you have questions or concerns about your Care? No.  Actions: * If pain score is 4 or above: No action needed, pain <4.  Have you developed a fever since your procedure? no  2.   Have you had an respiratory symptoms (SOB or cough) since your procedure? no  3.   Have you tested positive for COVID 19 since your procedure no  4.   Have you had any family members/close contacts diagnosed with the COVID 19 since your procedure?  no   If yes to any of these questions please route to Joylene John, RN and Joella Prince, RN

## 2021-06-13 IMAGING — MG DIGITAL SCREENING BILAT W/ TOMO W/ CAD
6 of 10 series · 6 of 30 positions shown · non-contrast
Comparison: Previous exam(s).

CLINICAL DATA: Screening.

EXAM:
DIGITAL SCREENING BILATERAL MAMMOGRAM WITH TOMO AND CAD

[R CC synth-2D]
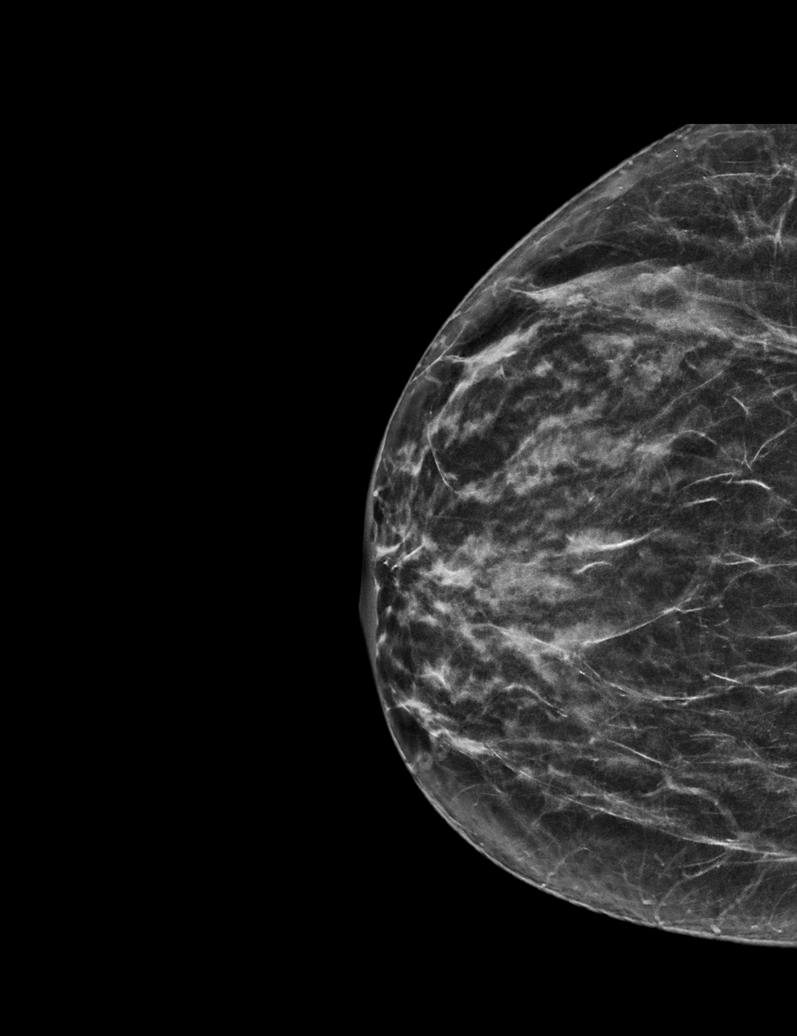

[R MLO synth-2D]
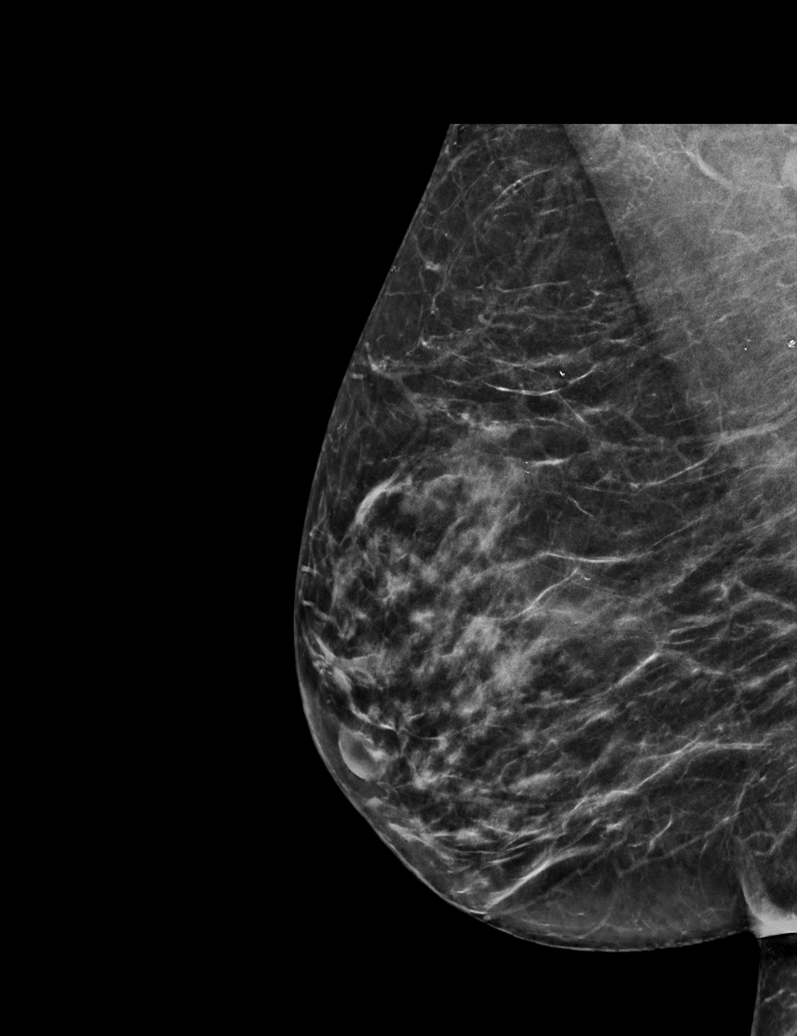

[L CC synth-2D (1 of 2)]
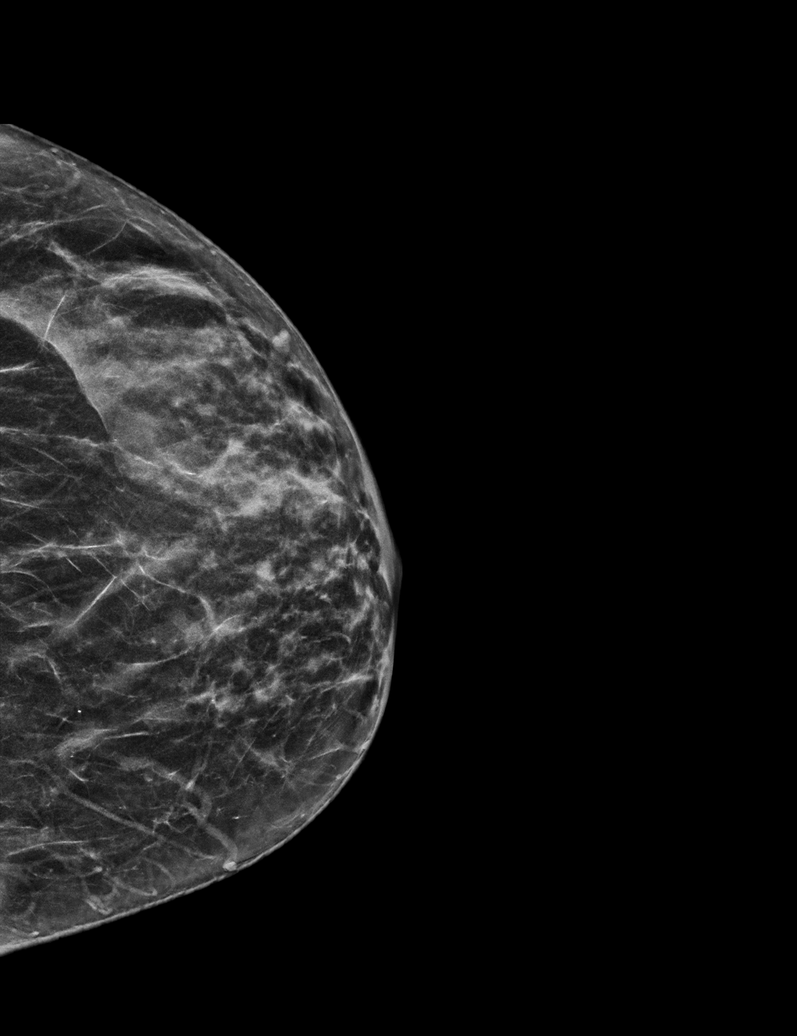

[L CC synth-2D (2 of 2)]
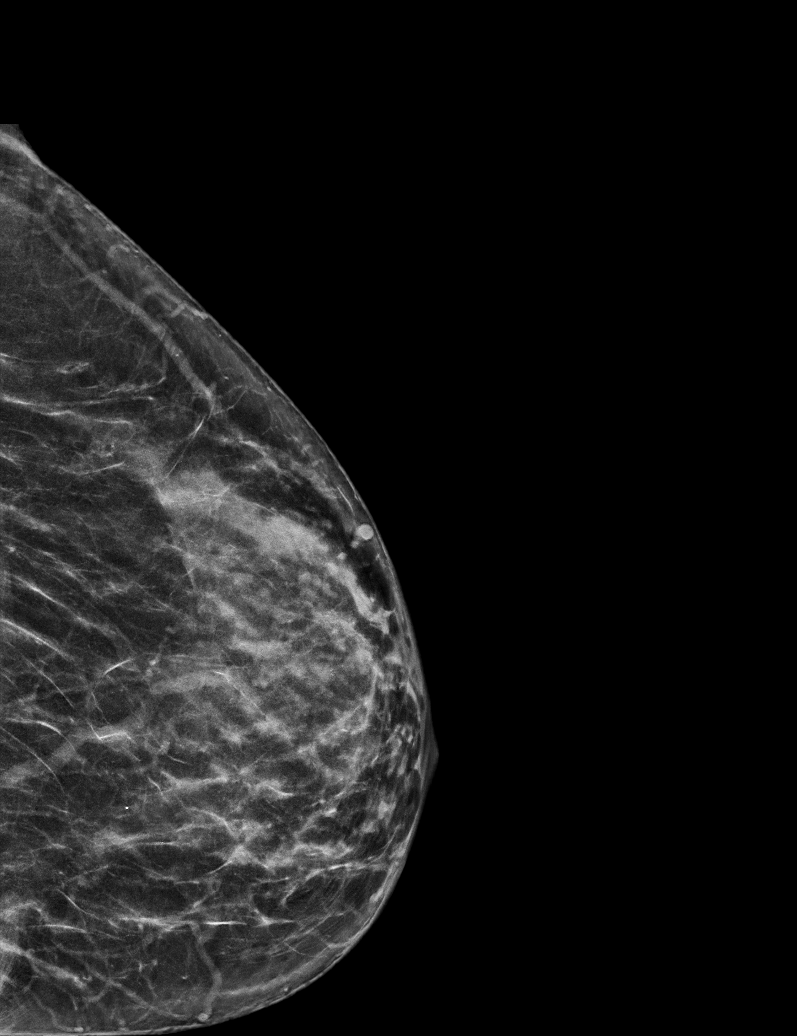

[L MLO synth-2D]
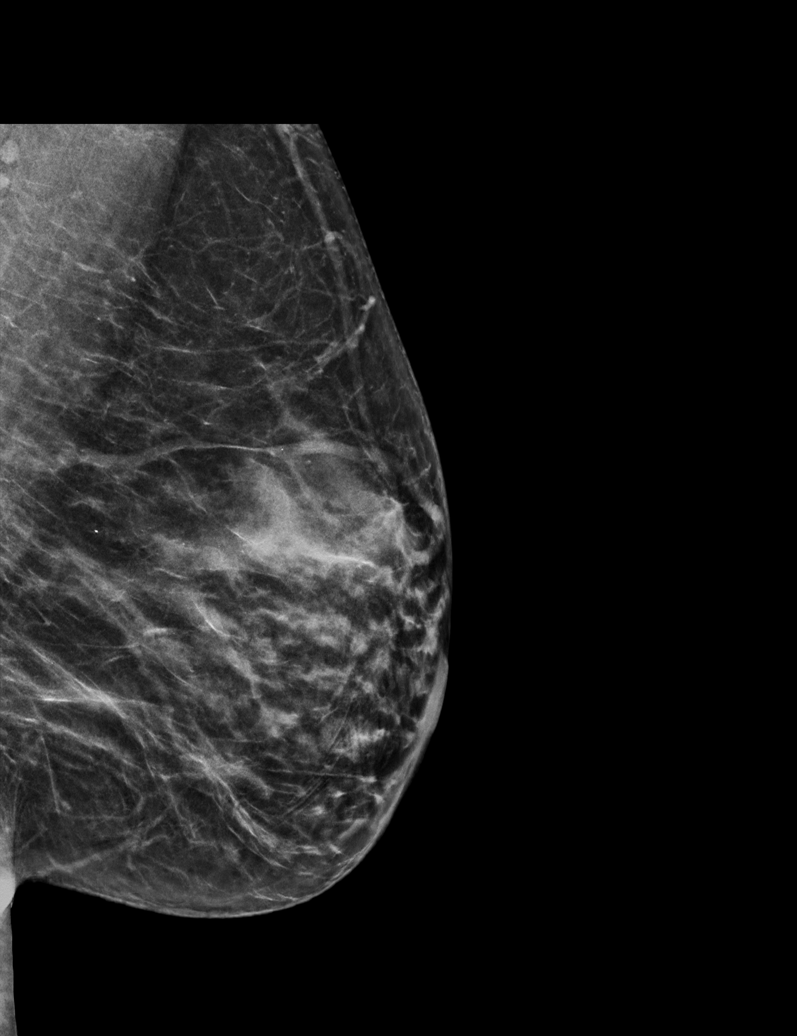

[L MLO tomo · tomo slice 31/62.0]
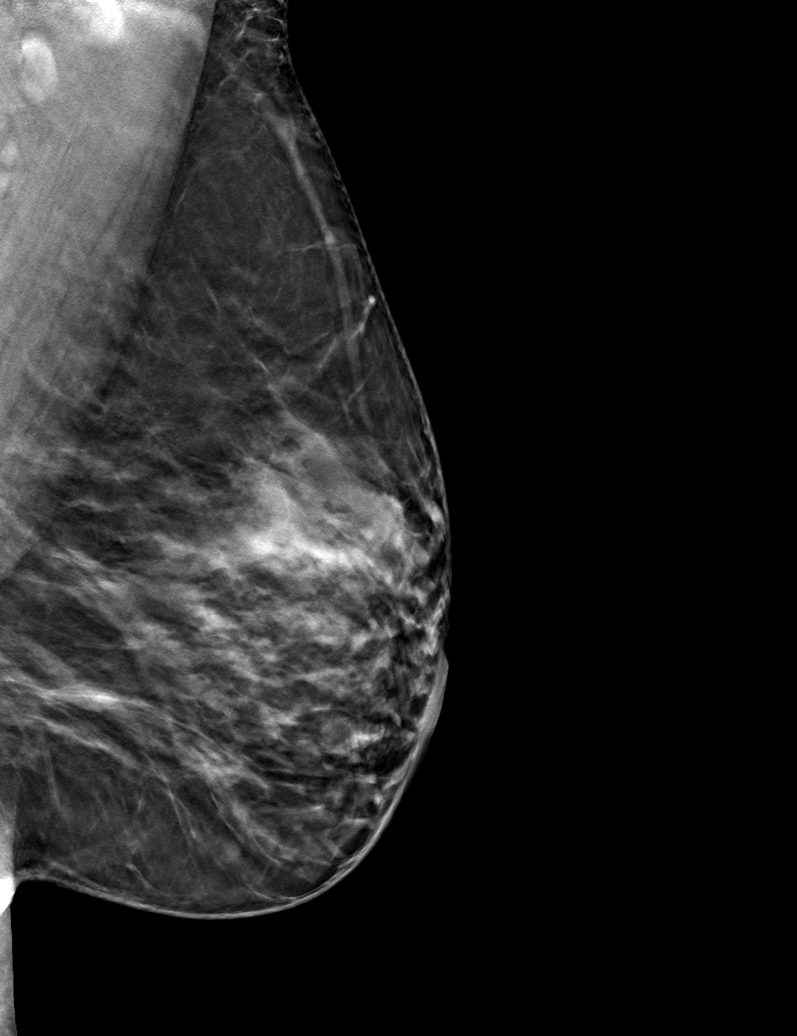

[6 of 30 positions shown; findings below may reference images not displayed]

ACR Breast Density Category c: The breast tissue is heterogeneously
dense, which may obscure small masses.
FINDINGS: There are no findings suspicious for malignancy. Images were
processed with CAD.
IMPRESSION: No mammographic evidence of malignancy. A result letter of this
screening mammogram will be mailed directly to the patient.

RECOMMENDATION:
Screening mammogram in one year. (Code:[id number])

BI-RADS CATEGORY  1: Negative.

## 2021-08-01 ENCOUNTER — Ambulatory Visit: Payer: 59

## 2021-08-12 ENCOUNTER — Other Ambulatory Visit: Payer: Self-pay | Admitting: Family Medicine

## 2021-08-25 ENCOUNTER — Ambulatory Visit: Payer: 59

## 2021-09-06 ENCOUNTER — Ambulatory Visit
Admission: RE | Admit: 2021-09-06 | Discharge: 2021-09-06 | Disposition: A | Discharge status: Home / Self Care | Payer: 59 | Source: Ambulatory Visit | Attending: Family Medicine | Admitting: Family Medicine

## 2021-09-06 ENCOUNTER — Other Ambulatory Visit (INDEPENDENT_AMBULATORY_CARE_PROVIDER_SITE_OTHER): Payer: 59

## 2021-09-06 ENCOUNTER — Telehealth: Payer: Self-pay | Admitting: Family Medicine

## 2021-09-06 DIAGNOSIS — Z1231 Encounter for screening mammogram for malignant neoplasm of breast: Secondary | ICD-10-CM

## 2021-09-06 DIAGNOSIS — D696 Thrombocytopenia, unspecified: Secondary | ICD-10-CM

## 2021-09-06 DIAGNOSIS — E7800 Pure hypercholesterolemia, unspecified: Secondary | ICD-10-CM

## 2021-09-06 DIAGNOSIS — R7309 Other abnormal glucose: Secondary | ICD-10-CM

## 2021-09-06 DIAGNOSIS — D509 Iron deficiency anemia, unspecified: Secondary | ICD-10-CM | POA: Diagnosis not present

## 2021-09-06 DIAGNOSIS — Z0000 Encounter for general adult medical examination without abnormal findings: Secondary | ICD-10-CM

## 2021-09-06 NOTE — Telephone Encounter (Signed)
Lab orders

## 2021-09-07 LAB — COMPREHENSIVE METABOLIC PANEL
ALT: 9 IU/L (ref 0–32)
AST: 16 IU/L (ref 0–40)
Albumin/Globulin Ratio: 1.8 (ref 1.2–2.2)
Albumin: 4.7 g/dL (ref 3.8–4.9)
Alkaline Phosphatase: 70 IU/L (ref 44–121)
BUN/Creatinine Ratio: 18 (ref 9–23)
BUN: 14 mg/dL (ref 6–24)
Bilirubin Total: 0.7 mg/dL (ref 0.0–1.2)
CO2: 25 mmol/L (ref 20–29)
Calcium: 10.1 mg/dL (ref 8.7–10.2)
Chloride: 104 mmol/L (ref 96–106)
Creatinine, Ser: 0.77 mg/dL (ref 0.57–1.00)
Globulin, Total: 2.6 g/dL (ref 1.5–4.5)
Glucose: 102 mg/dL — ABNORMAL HIGH (ref 70–99)
Potassium: 4.4 mmol/L (ref 3.5–5.2)
Sodium: 143 mmol/L (ref 134–144)
Total Protein: 7.3 g/dL (ref 6.0–8.5)
eGFR: 90 mL/min/{1.73_m2} (ref >59)

## 2021-09-07 LAB — LIPID PANEL
Chol/HDL Ratio: 2.7 ratio (ref 0.0–4.4)
Cholesterol, Total: 174 mg/dL (ref 100–199)
HDL: 65 mg/dL (ref >39)
LDL Chol Calc (NIH): 94 mg/dL (ref 0–99)
Triglycerides: 82 mg/dL (ref 0–149)
VLDL Cholesterol Cal: 15 mg/dL (ref 5–40)

## 2021-09-07 LAB — CBC WITH DIFFERENTIAL/PLATELET
Basophils Absolute: 0 10*3/uL (ref 0.0–0.2)
Basos: 0 %
EOS (ABSOLUTE): 0 10*3/uL (ref 0.0–0.4)
Eos: 1 %
Hematocrit: 34.1 % (ref 34.0–46.6)
Hemoglobin: 10.8 g/dL — ABNORMAL LOW (ref 11.1–15.9)
Immature Grans (Abs): 0 10*3/uL (ref 0.0–0.1)
Immature Granulocytes: 0 %
Lymphocytes Absolute: 1.5 10*3/uL (ref 0.7–3.1)
Lymphs: 30 %
MCH: 23.8 pg — ABNORMAL LOW (ref 26.6–33.0)
MCHC: 31.7 g/dL (ref 31.5–35.7)
MCV: 75 fL — ABNORMAL LOW (ref 79–97)
Monocytes Absolute: 0.3 10*3/uL (ref 0.1–0.9)
Monocytes: 7 %
Neutrophils Absolute: 3.1 10*3/uL (ref 1.4–7.0)
Neutrophils: 62 %
Platelets: 205 10*3/uL (ref 150–450)
RBC: 4.54 x10E6/uL (ref 3.77–5.28)
RDW: 14.1 % (ref 11.7–15.4)
WBC: 5.1 10*3/uL (ref 3.4–10.8)

## 2021-09-07 LAB — TSH: TSH: 2.08 u[IU]/mL (ref 0.450–4.500)

## 2021-09-07 LAB — FERRITIN: Ferritin: 132 ng/mL (ref 15–150)

## 2021-09-07 LAB — HEMOGLOBIN A1C
Est. average glucose Bld gHb Est-mCnc: 123 mg/dL
Hgb A1c MFr Bld: 5.9 % — ABNORMAL HIGH (ref 4.8–5.6)

## 2021-09-07 LAB — IRON: Iron: 48 ug/dL (ref 27–159)

## 2021-09-07 LAB — SPECIMEN STATUS REPORT

## 2021-09-08 ENCOUNTER — Other Ambulatory Visit: Payer: Self-pay | Admitting: Family Medicine

## 2021-09-08 DIAGNOSIS — R928 Other abnormal and inconclusive findings on diagnostic imaging of breast: Secondary | ICD-10-CM

## 2021-09-11 ENCOUNTER — Telehealth: Payer: Self-pay | Admitting: Family Medicine

## 2021-09-11 DIAGNOSIS — D696 Thrombocytopenia, unspecified: Secondary | ICD-10-CM

## 2021-09-11 DIAGNOSIS — Z0000 Encounter for general adult medical examination without abnormal findings: Secondary | ICD-10-CM

## 2021-09-11 DIAGNOSIS — D509 Iron deficiency anemia, unspecified: Secondary | ICD-10-CM

## 2021-09-11 DIAGNOSIS — R7309 Other abnormal glucose: Secondary | ICD-10-CM

## 2021-09-11 DIAGNOSIS — E7800 Pure hypercholesterolemia, unspecified: Secondary | ICD-10-CM

## 2021-09-11 NOTE — Telephone Encounter (Signed)
-----   Message from Ellamae Sia sent at 08/22/2021  3:11 PM EDT ----- Regarding: Lab orders for Tuesday, 7.28.23 Patient is scheduled for CPX labs, please order future labs, Thanks , Karna Christmas

## 2021-09-13 ENCOUNTER — Ambulatory Visit (INDEPENDENT_AMBULATORY_CARE_PROVIDER_SITE_OTHER): Payer: 59 | Admitting: Family Medicine

## 2021-09-13 ENCOUNTER — Encounter: Payer: Self-pay | Admitting: Family Medicine

## 2021-09-13 VITALS — BP 114/76 | HR 68 | Ht 66.0 in | Wt 167.8 lb

## 2021-09-13 DIAGNOSIS — E7800 Pure hypercholesterolemia, unspecified: Secondary | ICD-10-CM

## 2021-09-13 DIAGNOSIS — Z0000 Encounter for general adult medical examination without abnormal findings: Secondary | ICD-10-CM | POA: Diagnosis not present

## 2021-09-13 DIAGNOSIS — D696 Thrombocytopenia, unspecified: Secondary | ICD-10-CM | POA: Diagnosis not present

## 2021-09-13 DIAGNOSIS — R7309 Other abnormal glucose: Secondary | ICD-10-CM

## 2021-09-13 DIAGNOSIS — D509 Iron deficiency anemia, unspecified: Secondary | ICD-10-CM

## 2021-09-13 MED ORDER — FERROUS SULFATE 325 (65 FE) MG PO TABS: 325.0000 mg | ORAL_TABLET | Freq: Every day | ORAL | 0 refills | Status: AC

## 2021-09-13 NOTE — Assessment & Plan Note (Signed)
May be due to iron def This draw plt ct nl at 205

## 2021-09-13 NOTE — Assessment & Plan Note (Signed)
Disc goals for lipids and reasons to control them Rev last labs with pt Rev low sat fat diet in detail Well controlled with diet and atorvastatin 5 mg daily

## 2021-09-13 NOTE — Patient Instructions (Addendum)
Keep up the good work with exercise   The shoulder exercises help also   Take your iron daily instead of every other day  Let's re check labs in 3-6 months   Get a flu and covid shot in the fall

## 2021-09-13 NOTE — Assessment & Plan Note (Signed)
Lab Results  Component Value Date   HGBA1C 5.9 (H) 09/06/2021   disc imp of low glycemic diet and wt loss to prevent DM2

## 2021-09-13 NOTE — Assessment & Plan Note (Signed)
Reviewed health habits including diet and exercise and skin cancer prevention Reviewed appropriate screening tests for age  Also reviewed health mt list, fam hx and immunization status , as well as social and family history   See HPI Labs reviewed  Had shingrix vaccines  Pap utd Mammogram done-pending addl views for asymmetry Colonoscopy utd from 2022 Enc her to continue exercise

## 2021-09-13 NOTE — Progress Notes (Signed)
Subjective:    Patient ID: Leslie Gomez, female    DOB: 1966/01/15, 56 y.o.   MRN: [id number]  HPI Here for health maintenance exam and to review chronic medical problems    Wt Readings from Last 3 Encounters:  09/13/21 167 lb 12.8 oz (76.1 kg)  12/08/20 165 lb (74.8 kg)  11/22/20 165 lb (74.8 kg)   27.08 kg/m  Doing well  In a supervisor position  Going to the beach in august and then oct a cruise    Immunization History  Administered Date(s) Administered   Influenza Inj Mdck Quad Pf 11/21/2018   Influenza Split 12/21/2010   Influenza Whole 12/20/2006, 11/21/2007, 11/30/2008, 12/01/2009   PFIZER(Purple Top)SARS-COV-2 Vaccination 03/28/2019, 04/18/2019, 12/26/2019   Td 10/26/2005   Tdap 05/18/2015   Health Maintenance Due  Topic Date Due   Zoster Vaccines- Shingrix (1 of 2) Never done   COVID-19 Vaccine (4 - Pfizer series) 02/20/2020  Had both of her shingrix vaccines at [facility] - no side effects and did great   Pap 08/2017 nl with neg HPV No new partners  No symptoms  No periods in 5 years  Did ok with menopausal symptoms   Mammogram 08/2021  Asymmetry R breast noted , f/u arranged for diagnostic imaging -scheduled for 8/10  Breasts are dense  Self breast exam : no lumps  No fam h/o breast cancer   Colonoscopy 11/2020 10 y recall  BP Readings from Last 3 Encounters:  09/13/21 114/76  12/08/20 (!) 131/56  09/16/20 130/80   Pulse Readings from Last 3 Encounters:  09/13/21 68  12/08/20 (!) 56  09/16/20 (!) 59   Eats healthy for the most part  Splurges when she travels  Would like to loose weight  Not gaining either   Was going to the gym Did injure her shoulder-better now  [employer] 5 miles per day and loves it  Goes to gym in bad weather for treadmill    H/o thrombocytopenia and anemia from heavy menses  Also strong family h/o iron def (mother needs iron infusions)  Lab Results  Component Value Date   WBC 5.1 09/06/2021   HGB 10.8  (L) 09/06/2021   HCT 34.1 09/06/2021   MCV 75 (L) 09/06/2021   PLT 205 09/06/2021   Lab Results  Component Value Date   IRON 48 09/06/2021   FERRITIN 132 09/06/2021   Hb down from 11 but plt better  Feels fine / does not see much of a difference  Taking ferrous sulfate 325 mg qod  Diet is balanced    Past elevated glucose Lab Results  Component Value Date   HGBA1C 5.9 (H) 09/06/2021  This is down from 6 Not much sugar/sweets  No sugar drinks / occ diet soda    Hyperlipidemia Lab Results  Component Value Date   CHOL 174 09/06/2021   CHOL 178 09/03/2020   CHOL 162 08/29/2019   Lab Results  Component Value Date   HDL 65 09/06/2021   HDL 60 09/03/2020   HDL 68 08/29/2019   Lab Results  Component Value Date   LDLCALC 94 09/06/2021   LDLCALC 105 (H) 09/03/2020   LDLCALC 83 08/29/2019   Lab Results  Component Value Date   TRIG 82 09/06/2021   TRIG 71 09/03/2020   TRIG 51 08/29/2019   Lab Results  Component Value Date   CHOLHDL 2.7 09/06/2021   CHOLHDL 3.0 09/03/2020   CHOLHDL 2.4 08/29/2019   No results found for: "  LDLDIRECT" Atorvastatin 5 mg daily  LDL is down to 94 Avoids fast food/greasy stuff  Lab Results  Component Value Date   CREATININE 0.77 09/06/2021   BUN 14 09/06/2021   NA 143 09/06/2021   K 4.4 09/06/2021   CL 104 09/06/2021   CO2 25 09/06/2021   Lab Results  Component Value Date   ALT 9 09/06/2021   AST 16 09/06/2021   ALKPHOS 70 09/06/2021   BILITOT 0.7 09/06/2021   Lab Results  Component Value Date   TSH 2.080 09/06/2021    Patient Active Problem List   Diagnosis Date Noted   Thrombocytopenia ([place]) 09/02/2019   Elevated glucose 08/27/2017   Colon cancer screening 05/18/2015   Encounter for routine gynecological examination 01/17/2011   Routine general medical examination at a health care facility 01/09/2011   Other screening mammogram 01/04/2011   HYPERCHOLESTEROLEMIA 11/19/2006   Anemia 11/19/2006   Past Medical  History:  Diagnosis Date   Anemia    NOS since childhood runs in family   Hyperlipidemia    Past Surgical History:  Procedure Laterality Date   ABDOMINOPLASTY  2018   CESAREAN SECTION     WISDOM TOOTH EXTRACTION     Social History   Tobacco Use   Smoking status: Never   Smokeless tobacco: Never  Vaping Use   Vaping Use: Never used  Substance Use Topics   Alcohol use: No    Alcohol/week: 0.0 standard drinks of alcohol   Drug use: No   Family History  Problem Relation Age of Onset   Hyperlipidemia Mother    Hypertension Mother    Anemia Mother    Stomach cancer Father    Cancer Father        stomach CA   Colon cancer Neg Hx    Breast cancer Neg Hx    Esophageal cancer Neg Hx    Rectal cancer Neg Hx    No Known Allergies Current Outpatient Medications on File Prior to Visit  Medication Sig Dispense Refill   atorvastatin (LIPITOR) 10 MG tablet TAKE ONE-HALF TABLET BY  MOUTH DAILY 45 tablet 3   No current facility-administered medications on file prior to visit.    Review of Systems  Constitutional:  Negative for activity change, appetite change, fatigue, fever and unexpected weight change.  HENT:  Negative for congestion, ear pain, rhinorrhea, sinus pressure and sore throat.   Eyes:  Negative for pain, redness and visual disturbance.  Respiratory:  Negative for cough, shortness of breath and wheezing.   Cardiovascular:  Negative for chest pain and palpitations.  Gastrointestinal:  Negative for abdominal pain, blood in stool, constipation and diarrhea.  Endocrine: Negative for polydipsia and polyuria.  Genitourinary:  Negative for dysuria, frequency and urgency.  Musculoskeletal:  Negative for arthralgias, back pain and myalgias.  Skin:  Negative for pallor and rash.  Allergic/Immunologic: Negative for environmental allergies.  Neurological:  Negative for dizziness, syncope and headaches.  Hematological:  Negative for adenopathy. Does not bruise/bleed easily.   Psychiatric/Behavioral:  Negative for decreased concentration and dysphoric mood. The patient is not nervous/anxious.        Objective:   Physical Exam Constitutional:      General: She is not in acute distress.    Appearance: Normal appearance. She is well-developed and normal weight. She is not ill-appearing or diaphoretic.  HENT:     Head: Normocephalic and atraumatic.     Right Ear: Tympanic membrane, ear canal and external ear normal.  Left Ear: Tympanic membrane, ear canal and external ear normal.     Nose: Nose normal. No congestion.     Mouth/Throat:     Mouth: Mucous membranes are moist.     Pharynx: Oropharynx is clear. No posterior oropharyngeal erythema.  Eyes:     General: No scleral icterus.    Extraocular Movements: Extraocular movements intact.     Conjunctiva/sclera: Conjunctivae normal.     Pupils: Pupils are equal, round, and reactive to light.  Neck:     Thyroid: No thyromegaly.     Vascular: No carotid bruit or JVD.  Cardiovascular:     Rate and Rhythm: Normal rate and regular rhythm.     Pulses: Normal pulses.     Heart sounds: Normal heart sounds.     No gallop.  Pulmonary:     Effort: Pulmonary effort is normal. No respiratory distress.     Breath sounds: Normal breath sounds. No wheezing.     Comments: Good air exch Chest:     Chest wall: No tenderness.  Abdominal:     General: Bowel sounds are normal. There is no distension or abdominal bruit.     Palpations: Abdomen is soft. There is no mass.     Tenderness: There is no abdominal tenderness.     Hernia: No hernia is present.  Genitourinary:    Comments: Breast exam: No mass, nodules, thickening, tenderness, bulging, retraction, inflamation, nipple discharge or skin changes noted.  No axillary or clavicular LA.     Musculoskeletal:        General: No tenderness. Normal range of motion.     Cervical back: Normal range of motion and neck supple. No rigidity. No muscular tenderness.     Right  lower leg: No edema.     Left lower leg: No edema.     Comments: No kyphosis   Lymphadenopathy:     Cervical: No cervical adenopathy.  Skin:    General: Skin is warm and dry.     Coloration: Skin is not pale.     Findings: No erythema or rash.  Neurological:     Mental Status: She is alert. Mental status is at baseline.     Cranial Nerves: No cranial nerve deficit.     Motor: No abnormal muscle tone.     Coordination: Coordination normal.     Gait: Gait normal.     Deep Tendon Reflexes: Reflexes are normal and symmetric. Reflexes normal.  Psychiatric:        Mood and Affect: Mood normal.        Cognition and Memory: Cognition and memory normal.           Assessment & Plan:   Problem List Items Addressed This Visit       Hematopoietic and Hemostatic   Thrombocytopenia ([place])    May be due to iron def This draw plt ct nl at 205         Other   Anemia    Iron def anemia is fairly stable  Suspect genetic / no longer has periods, and pt tol well Hb 10.8  Will inc ferrous sulfate to daily 325 mg  Iron is in low nl range at 48 -over 50 may be more beneficial  Consider hematology in future if this continues to drop Enc to continue balanced diet also      Relevant Medications   ferrous sulfate 325 (65 FE) MG tablet   Elevated glucose    Lab  Results  Component Value Date   HGBA1C 5.9 (H) 09/06/2021  disc imp of low glycemic diet and wt loss to prevent DM2        HYPERCHOLESTEROLEMIA    Disc goals for lipids and reasons to control them Rev last labs with pt Rev low sat fat diet in detail Well controlled with diet and atorvastatin 5 mg daily       Routine general medical examination at a health care facility - Primary    Reviewed health habits including diet and exercise and skin cancer prevention Reviewed appropriate screening tests for age  Also reviewed health mt list, fam hx and immunization status , as well as social and family history   See HPI Labs  reviewed  Had shingrix vaccines  Pap utd Mammogram done-pending addl views for asymmetry Colonoscopy utd from 2022 Enc her to continue exercise

## 2021-09-13 NOTE — Assessment & Plan Note (Signed)
Iron def anemia is fairly stable  Suspect genetic / no longer has periods, and pt tol well Hb 10.8  Will inc ferrous sulfate to daily 325 mg  Iron is in low nl range at 48 -over 50 may be more beneficial  Consider hematology in future if this continues to drop Enc to continue balanced diet also

## 2021-09-29 ENCOUNTER — Ambulatory Visit
Admission: RE | Admit: 2021-09-29 | Discharge: 2021-09-29 | Disposition: A | Discharge status: Home / Self Care | Payer: 59 | Source: Ambulatory Visit | Attending: Family Medicine | Admitting: Family Medicine

## 2021-09-29 DIAGNOSIS — R928 Other abnormal and inconclusive findings on diagnostic imaging of breast: Secondary | ICD-10-CM

## 2021-10-19 ENCOUNTER — Other Ambulatory Visit: Payer: 59

## 2021-10-26 ENCOUNTER — Encounter: Payer: 59 | Admitting: Family Medicine

## 2022-01-31 ENCOUNTER — Ambulatory Visit: Payer: 59 | Admitting: Family Medicine

## 2022-02-06 ENCOUNTER — Other Ambulatory Visit: Payer: 59

## 2022-02-06 DIAGNOSIS — Z0000 Encounter for general adult medical examination without abnormal findings: Secondary | ICD-10-CM

## 2022-02-06 DIAGNOSIS — R7309 Other abnormal glucose: Secondary | ICD-10-CM

## 2022-02-06 DIAGNOSIS — D509 Iron deficiency anemia, unspecified: Secondary | ICD-10-CM

## 2022-02-06 DIAGNOSIS — E7800 Pure hypercholesterolemia, unspecified: Secondary | ICD-10-CM

## 2022-02-06 DIAGNOSIS — D696 Thrombocytopenia, unspecified: Secondary | ICD-10-CM

## 2022-02-07 LAB — COMPREHENSIVE METABOLIC PANEL
ALT: 8 IU/L (ref 0–32)
AST: 16 IU/L (ref 0–40)
Albumin/Globulin Ratio: 1.6 (ref 1.2–2.2)
Albumin: 4.5 g/dL (ref 3.8–4.9)
Alkaline Phosphatase: 73 IU/L (ref 44–121)
BUN/Creatinine Ratio: 15 (ref 9–23)
BUN: 12 mg/dL (ref 6–24)
Bilirubin Total: 0.6 mg/dL (ref 0.0–1.2)
CO2: 23 mmol/L (ref 20–29)
Calcium: 9.5 mg/dL (ref 8.7–10.2)
Chloride: 104 mmol/L (ref 96–106)
Creatinine, Ser: 0.79 mg/dL (ref 0.57–1.00)
Globulin, Total: 2.8 g/dL (ref 1.5–4.5)
Glucose: 103 mg/dL — ABNORMAL HIGH (ref 70–99)
Potassium: 3.9 mmol/L (ref 3.5–5.2)
Sodium: 141 mmol/L (ref 134–144)
Total Protein: 7.3 g/dL (ref 6.0–8.5)
eGFR: 88 mL/min/{1.73_m2} (ref >59)

## 2022-02-07 LAB — CBC WITH DIFFERENTIAL/PLATELET
Basophils Absolute: 0 10*3/uL (ref 0.0–0.2)
Basos: 0 %
EOS (ABSOLUTE): 0.1 10*3/uL (ref 0.0–0.4)
Eos: 1 %
Hematocrit: 35 % (ref 34.0–46.6)
Hemoglobin: 10.9 g/dL — ABNORMAL LOW (ref 11.1–15.9)
Immature Grans (Abs): 0 10*3/uL (ref 0.0–0.1)
Immature Granulocytes: 0 %
Lymphocytes Absolute: 1.6 10*3/uL (ref 0.7–3.1)
Lymphs: 36 %
MCH: 23.5 pg — ABNORMAL LOW (ref 26.6–33.0)
MCHC: 31.1 g/dL — ABNORMAL LOW (ref 31.5–35.7)
MCV: 75 fL — ABNORMAL LOW (ref 79–97)
Monocytes Absolute: 0.3 10*3/uL (ref 0.1–0.9)
Monocytes: 6 %
Neutrophils Absolute: 2.6 10*3/uL (ref 1.4–7.0)
Neutrophils: 57 %
Platelets: 140 10*3/uL — ABNORMAL LOW (ref 150–450)
RBC: 4.64 x10E6/uL (ref 3.77–5.28)
RDW: 14 % (ref 11.7–15.4)
WBC: 4.6 10*3/uL (ref 3.4–10.8)

## 2022-02-07 LAB — LIPID PANEL
Chol/HDL Ratio: 3.1 ratio (ref 0.0–4.4)
Cholesterol, Total: 216 mg/dL — ABNORMAL HIGH (ref 100–199)
HDL: 70 mg/dL (ref >39)
LDL Chol Calc (NIH): 133 mg/dL — ABNORMAL HIGH (ref 0–99)
Triglycerides: 76 mg/dL (ref 0–149)
VLDL Cholesterol Cal: 13 mg/dL (ref 5–40)

## 2022-02-07 LAB — IRON: Iron: 67 ug/dL (ref 27–159)

## 2022-02-07 LAB — HEMOGLOBIN A1C
Est. average glucose Bld gHb Est-mCnc: 131 mg/dL
Hgb A1c MFr Bld: 6.2 % — ABNORMAL HIGH (ref 4.8–5.6)

## 2022-02-07 LAB — FERRITIN: Ferritin: 106 ng/mL (ref 15–150)

## 2022-02-07 LAB — TSH: TSH: 1.66 u[IU]/mL (ref 0.450–4.500)

## 2022-03-27 ENCOUNTER — Other Ambulatory Visit: Payer: Self-pay | Admitting: Family Medicine

## 2022-03-27 DIAGNOSIS — Z1231 Encounter for screening mammogram for malignant neoplasm of breast: Secondary | ICD-10-CM

## 2022-07-13 ENCOUNTER — Other Ambulatory Visit: Payer: Self-pay | Admitting: Family Medicine

## 2022-09-10 ENCOUNTER — Telehealth: Payer: Self-pay | Admitting: Family Medicine

## 2022-09-10 DIAGNOSIS — E7800 Pure hypercholesterolemia, unspecified: Secondary | ICD-10-CM

## 2022-09-10 DIAGNOSIS — Z0000 Encounter for general adult medical examination without abnormal findings: Secondary | ICD-10-CM

## 2022-09-10 DIAGNOSIS — D696 Thrombocytopenia, unspecified: Secondary | ICD-10-CM

## 2022-09-10 DIAGNOSIS — R7309 Other abnormal glucose: Secondary | ICD-10-CM

## 2022-09-10 DIAGNOSIS — D509 Iron deficiency anemia, unspecified: Secondary | ICD-10-CM

## 2022-09-10 NOTE — Telephone Encounter (Signed)
-----   Message from Lovena Neighbours sent at 08/25/2022  3:19 PM EDT ----- Regarding: Labs for 7.22.24 Please put physical lab orders in future. Thank you, Denny Peon

## 2022-09-11 ENCOUNTER — Other Ambulatory Visit (INDEPENDENT_AMBULATORY_CARE_PROVIDER_SITE_OTHER): Payer: 59

## 2022-09-11 ENCOUNTER — Ambulatory Visit
Admission: RE | Admit: 2022-09-11 | Discharge: 2022-09-11 | Disposition: A | Discharge status: Home / Self Care | Payer: 59 | Source: Ambulatory Visit

## 2022-09-11 DIAGNOSIS — Z1231 Encounter for screening mammogram for malignant neoplasm of breast: Secondary | ICD-10-CM

## 2022-09-11 DIAGNOSIS — Z0000 Encounter for general adult medical examination without abnormal findings: Secondary | ICD-10-CM

## 2022-09-11 DIAGNOSIS — R7309 Other abnormal glucose: Secondary | ICD-10-CM

## 2022-09-11 DIAGNOSIS — E7800 Pure hypercholesterolemia, unspecified: Secondary | ICD-10-CM

## 2022-09-11 DIAGNOSIS — D509 Iron deficiency anemia, unspecified: Secondary | ICD-10-CM

## 2022-09-11 DIAGNOSIS — D696 Thrombocytopenia, unspecified: Secondary | ICD-10-CM

## 2022-09-11 NOTE — Addendum Note (Signed)
Addended by: Alvina Chou on: 09/11/2022 08:25 AM   Modules accepted: Orders

## 2022-09-12 LAB — CBC WITH DIFFERENTIAL/PLATELET
Basophils Absolute: 0 10*3/uL (ref 0.0–0.2)
Basos: 0 %
EOS (ABSOLUTE): 0.1 10*3/uL (ref 0.0–0.4)
Eos: 2 %
Hematocrit: 34.7 % (ref 34.0–46.6)
Hemoglobin: 10.6 g/dL — ABNORMAL LOW (ref 11.1–15.9)
Immature Grans (Abs): 0 10*3/uL (ref 0.0–0.1)
Immature Granulocytes: 0 %
Lymphocytes Absolute: 2 10*3/uL (ref 0.7–3.1)
Lymphs: 39 %
MCH: 23.2 pg — ABNORMAL LOW (ref 26.6–33.0)
MCHC: 30.5 g/dL — ABNORMAL LOW (ref 31.5–35.7)
MCV: 76 fL — ABNORMAL LOW (ref 79–97)
Monocytes Absolute: 0.3 10*3/uL (ref 0.1–0.9)
Monocytes: 6 %
Neutrophils Absolute: 2.7 10*3/uL (ref 1.4–7.0)
Neutrophils: 53 %
Platelets: 219 10*3/uL (ref 150–450)
RBC: 4.56 x10E6/uL (ref 3.77–5.28)
RDW: 14.1 % (ref 11.7–15.4)
WBC: 5 10*3/uL (ref 3.4–10.8)

## 2022-09-12 LAB — IRON: Iron: 67 ug/dL (ref 27–159)

## 2022-09-12 LAB — LIPID PANEL
Chol/HDL Ratio: 2.2 ratio (ref 0.0–4.4)
Cholesterol, Total: 155 mg/dL (ref 100–199)
HDL: 69 mg/dL (ref >39)
LDL Chol Calc (NIH): 74 mg/dL (ref 0–99)
Triglycerides: 60 mg/dL (ref 0–149)
VLDL Cholesterol Cal: 12 mg/dL (ref 5–40)

## 2022-09-12 LAB — COMPREHENSIVE METABOLIC PANEL
ALT: 10 IU/L (ref 0–32)
AST: 17 IU/L (ref 0–40)
Albumin: 4.5 g/dL (ref 3.8–4.9)
Alkaline Phosphatase: 74 IU/L (ref 44–121)
BUN/Creatinine Ratio: 17 (ref 9–23)
BUN: 14 mg/dL (ref 6–24)
Bilirubin Total: 0.7 mg/dL (ref 0.0–1.2)
CO2: 22 mmol/L (ref 20–29)
Calcium: 9.5 mg/dL (ref 8.7–10.2)
Chloride: 105 mmol/L (ref 96–106)
Creatinine, Ser: 0.84 mg/dL (ref 0.57–1.00)
Globulin, Total: 2.7 g/dL (ref 1.5–4.5)
Glucose: 97 mg/dL (ref 70–99)
Potassium: 4.4 mmol/L (ref 3.5–5.2)
Sodium: 142 mmol/L (ref 134–144)
Total Protein: 7.2 g/dL (ref 6.0–8.5)
eGFR: 81 mL/min/{1.73_m2} (ref >59)

## 2022-09-12 LAB — HEMOGLOBIN A1C
Est. average glucose Bld gHb Est-mCnc: 126 mg/dL
Hgb A1c MFr Bld: 6 % — ABNORMAL HIGH (ref 4.8–5.6)

## 2022-09-12 LAB — TSH: TSH: 1.95 u[IU]/mL (ref 0.450–4.500)

## 2022-09-18 ENCOUNTER — Other Ambulatory Visit (HOSPITAL_COMMUNITY)
Admission: RE | Admit: 2022-09-18 | Discharge: 2022-09-18 | Disposition: A | Discharge status: Home / Self Care | Payer: 59 | Source: Ambulatory Visit | Attending: Family Medicine | Admitting: Family Medicine

## 2022-09-18 ENCOUNTER — Ambulatory Visit (INDEPENDENT_AMBULATORY_CARE_PROVIDER_SITE_OTHER): Payer: 59 | Admitting: Family Medicine

## 2022-09-18 ENCOUNTER — Encounter: Payer: Self-pay | Admitting: Family Medicine

## 2022-09-18 VITALS — BP 120/70 | HR 73 | Temp 98.1°F | Ht 66.0 in | Wt 171.0 lb

## 2022-09-18 DIAGNOSIS — Z01419 Encounter for gynecological examination (general) (routine) without abnormal findings: Secondary | ICD-10-CM

## 2022-09-18 DIAGNOSIS — Z0000 Encounter for general adult medical examination without abnormal findings: Secondary | ICD-10-CM

## 2022-09-18 DIAGNOSIS — R7309 Other abnormal glucose: Secondary | ICD-10-CM

## 2022-09-18 DIAGNOSIS — D696 Thrombocytopenia, unspecified: Secondary | ICD-10-CM

## 2022-09-18 DIAGNOSIS — D509 Iron deficiency anemia, unspecified: Secondary | ICD-10-CM

## 2022-09-18 DIAGNOSIS — E7800 Pure hypercholesterolemia, unspecified: Secondary | ICD-10-CM

## 2022-09-18 MED ORDER — ATORVASTATIN CALCIUM 10 MG PO TABS: 5.0000 mg | ORAL_TABLET | Freq: Every day | ORAL | 3 refills | Status: DC

## 2022-09-18 NOTE — Assessment & Plan Note (Signed)
Disc goals for lipids and reasons to control them Rev last labs with pt Rev low sat fat diet in detail Well controlled with atorvastatin 5 mg daily   LDL of 74 Better diet Commended

## 2022-09-18 NOTE — Patient Instructions (Addendum)
Try to get 1200-1500 mg of calcium per day with at least 2000 iu of vitamin D - for bone health If you don't tolerate calcium - just take the D    Keep walking  Add some strength training to your routine, this is important for bone and brain health and can reduce your risk of falls and help your body use insulin properly and regulate weight  Light weights, exercise bands , and internet videos are a good way to start  Yoga (chair or regular), machines , floor exercises or a gym with machines are also good options  (careful of shoulder)   Try to get most of your carbohydrates from produce (with the exception of white potatoes) and whole grains Eat less bread/pasta/rice/snack foods/cereals/sweets and other items from the middle of the grocery store (processed carbs)  Keep up the good work!

## 2022-09-18 NOTE — Assessment & Plan Note (Signed)
Lab Results  Component Value Date   HGBA1C 6.0 (H) 09/11/2022   disc imp of low glycemic diet and wt loss to prevent DM2  Doing better with habits

## 2022-09-18 NOTE — Assessment & Plan Note (Signed)
Routine exam No complaints Normal  Pap sent

## 2022-09-18 NOTE — Assessment & Plan Note (Signed)
Reviewed health habits including diet and exercise and skin cancer prevention Reviewed appropriate screening tests for age  Also reviewed health mt list, fam hx and immunization status , as well as social and family history   See HPI Labs reviewed and ordered Mammogram utd 08/2022 Gyn examand pap today Colonoscopy 11/2020 Discussed supplements and exercise for bone health PHQ of 0  Great health habits/improving

## 2022-09-18 NOTE — Assessment & Plan Note (Signed)
Left pl count of 219 this draw Still anemic  No symptoms

## 2022-09-18 NOTE — Progress Notes (Signed)
Subjective:    Patient ID: Leslie Gomez, female    DOB: 03-07-1965, 57 y.o.   MRN: [id number]  HPI  Here for health maintenance exam and to review chronic medical problems   Wt Readings from Last 3 Encounters:  09/18/22 171 lb (77.6 kg)  09/13/21 167 lb 12.8 oz (76.1 kg)  12/08/20 165 lb (74.8 kg)   27.60 kg/m  Vitals:   09/18/22 0817  BP: 120/70  Pulse: 73  Temp: 98.1 F (36.7 C)  SpO2: 99%    Immunization History  Administered Date(s) Administered   Influenza Inj Mdck Quad Pf 11/21/2018   Influenza Split 12/21/2010   Influenza Whole 12/20/2006, 11/21/2007, 11/30/2008, 12/01/2009   Influenza-Unspecified 11/26/2020, 11/18/2021   Measles 03/20/1987   PFIZER(Purple Top)SARS-COV-2 Vaccination 03/28/2019, 04/18/2019, 12/26/2019, 10/23/2020   Td 10/26/2005   Tdap 05/18/2015   Zoster Recombinant(Shingrix) 10/08/2020, 12/10/2020    Health Maintenance Due  Topic Date Due   PAP SMEAR-Modifier  08/28/2022   Doing well  Feels ok  Traveling to [place]   Taking care of herself   Mammogram- 08/2022  Self breast exam- no lumps   Gyn care/ pap - pap 08/2017 normal with neg HPV  Due for that  No gyn problems or new partners     Colon cancer screening - colonoscopy 11/2020 - 10 y recall     Bone health  Falls: none  Fractures:none  Supplements -none  Exercise - walking 5 milles per day Shoulder issues -no weights/but has some    Mood    09/18/2022    8:25 AM 09/13/2021    8:19 AM 09/07/2020    4:44 PM 09/02/2019    4:01 PM 08/30/2018    3:42 PM  Depression screen PHQ 2/9  Decreased Interest 0 0 0 0 0  Down, Depressed, Hopeless 0 0 0 0 0  PHQ - 2 Score 0 0 0 0 0  Altered sleeping 0  0 0 0  Tired, decreased energy 0  0 0 0  Change in appetite 0  0 0 0  Feeling bad or failure about yourself  0  0 0 0  Trouble concentrating 0  0 0 0  Moving slowly or fidgety/restless 0  0 0 0  Suicidal thoughts 0  0 0 0  PHQ-9 Score 0  0 0 0  Difficult doing  work/chores Not difficult at all  Not difficult at all Not difficult at all     History of low platelets and anemia in the past  Since 14 , is hereditary  No longer having periods  Lab Results  Component Value Date   WBC 5.0 09/11/2022   HGB 10.6 (L) 09/11/2022   HCT 34.7 09/11/2022   MCV 76 (L) 09/11/2022   PLT 219 09/11/2022   Iron def from heavy menses Lab Results  Component Value Date   IRON 67 09/11/2022   FERRITIN 106 02/06/2022    Taking ferrous sulfate 325 mg daily  Not tired No symptoms   No [place] trait-was negative    Hyperlipidemia Lab Results  Component Value Date   CHOL 155 09/11/2022   CHOL 216 (H) 02/06/2022   CHOL 174 09/06/2021   Lab Results  Component Value Date   HDL 69 09/11/2022   HDL 70 02/06/2022   HDL 65 09/06/2021   Lab Results  Component Value Date   LDLCALC 74 09/11/2022   LDLCALC 133 (H) 02/06/2022   LDLCALC 94 09/06/2021   Lab Results  Component Value  Date   TRIG 60 09/11/2022   TRIG 76 02/06/2022   TRIG 82 09/06/2021   Lab Results  Component Value Date   CHOLHDL 2.2 09/11/2022   CHOLHDL 3.1 02/06/2022   CHOLHDL 2.7 09/06/2021   No results found for: "LDLDIRECT" Atorvastatin 5 mg daily  LDL is way down    Past elevated glucose  Lab Results  Component Value Date   HGBA1C 6.0 (H) 09/11/2022   Last time 5.9  Eating healthy  Avoiding sugar now- feels better since the holidays  Larey Seat off the wagon and now good again   Other labs Lab Results  Component Value Date   NA 142 09/11/2022   K 4.4 09/11/2022   CO2 22 09/11/2022   GLUCOSE 97 09/11/2022   BUN 14 09/11/2022   CREATININE 0.84 09/11/2022   CALCIUM 9.5 09/11/2022   EGFR 81 09/11/2022   GFRNONAA 94 08/29/2019   Lab Results  Component Value Date   ALT 10 09/11/2022   AST 17 09/11/2022   ALKPHOS 74 09/11/2022   BILITOT 0.7 09/11/2022   Lab Results  Component Value Date   TSH 1.950 09/11/2022      Patient Active Problem List   Diagnosis Date Noted    Thrombocytopenia (HCC) 09/02/2019   Elevated glucose 08/27/2017   Colon cancer screening 05/18/2015   Encounter for routine gynecological examination 01/17/2011   Routine general medical examination at a health care facility 01/09/2011   Other screening mammogram 01/04/2011   HYPERCHOLESTEROLEMIA 11/19/2006   Anemia 11/19/2006   Past Medical History:  Diagnosis Date   Anemia    NOS since childhood runs in family   Hyperlipidemia    Past Surgical History:  Procedure Laterality Date   ABDOMINOPLASTY  2018   CESAREAN SECTION     WISDOM TOOTH EXTRACTION     Social History   Tobacco Use   Smoking status: Never   Smokeless tobacco: Never  Vaping Use   Vaping status: Never Used  Substance Use Topics   Alcohol use: No    Alcohol/week: 0.0 standard drinks of alcohol   Drug use: No   Family History  Problem Relation Age of Onset   Hyperlipidemia Mother    Hypertension Mother    Anemia Mother    Stomach cancer Father    Cancer Father        stomach CA   Colon cancer Neg Hx    Breast cancer Neg Hx    Esophageal cancer Neg Hx    Rectal cancer Neg Hx    No Known Allergies Current Outpatient Medications on File Prior to Visit  Medication Sig Dispense Refill   ferrous sulfate 325 (65 FE) MG tablet Take 1 tablet (325 mg total) by mouth daily. 1 tablet 0   No current facility-administered medications on file prior to visit.    Review of Systems  Constitutional:  Negative for activity change, appetite change, fatigue, fever and unexpected weight change.  HENT:  Negative for congestion, ear pain, rhinorrhea, sinus pressure and sore throat.   Eyes:  Negative for pain, redness and visual disturbance.  Respiratory:  Negative for cough, shortness of breath and wheezing.   Cardiovascular:  Negative for chest pain and palpitations.  Gastrointestinal:  Negative for abdominal pain, blood in stool, constipation and diarrhea.  Endocrine: Negative for polydipsia and polyuria.   Genitourinary:  Negative for dysuria, frequency and urgency.  Musculoskeletal:  Negative for arthralgias, back pain and myalgias.  Skin:  Negative for pallor and rash.  Allergic/Immunologic: Negative for environmental allergies.  Neurological:  Negative for dizziness, syncope and headaches.  Hematological:  Negative for adenopathy. Does not bruise/bleed easily.  Psychiatric/Behavioral:  Negative for decreased concentration and dysphoric mood. The patient is not nervous/anxious.        Objective:   Physical Exam Constitutional:      General: She is not in acute distress.    Appearance: Normal appearance. She is well-developed. She is not ill-appearing or diaphoretic.  HENT:     Head: Normocephalic and atraumatic.     Right Ear: Tympanic membrane, ear canal and external ear normal.     Left Ear: Tympanic membrane, ear canal and external ear normal.     Nose: Nose normal. No congestion.     Mouth/Throat:     Mouth: Mucous membranes are moist.     Pharynx: Oropharynx is clear. No posterior oropharyngeal erythema.  Eyes:     General: No scleral icterus.    Extraocular Movements: Extraocular movements intact.     Conjunctiva/sclera: Conjunctivae normal.     Pupils: Pupils are equal, round, and reactive to light.  Neck:     Thyroid: No thyromegaly.     Vascular: No carotid bruit or JVD.  Cardiovascular:     Rate and Rhythm: Normal rate and regular rhythm.     Pulses: Normal pulses.     Heart sounds: Normal heart sounds.     No gallop.  Pulmonary:     Effort: Pulmonary effort is normal. No respiratory distress.     Breath sounds: Normal breath sounds. No wheezing.     Comments: Good air exch Chest:     Chest wall: No tenderness.  Abdominal:     General: Bowel sounds are normal. There is no distension or abdominal bruit.     Palpations: Abdomen is soft. There is no mass.     Tenderness: There is no abdominal tenderness.     Hernia: No hernia is present.  Genitourinary:     Comments: Breast exam: No mass, nodules, thickening, tenderness, bulging, retraction, inflamation, nipple discharge or skin changes noted.  No axillary or clavicular LA.                   Anus appears normal w/o hemorrhoids or masses       External genitalia : nl appearance and hair distribution/no lesions       Urethral meatus : nl size, no lesions or prolapse       Urethra: no masses, tenderness or scarring      Bladder : no masses or tenderness       Vagina: nl general appearance, no discharge or  Lesions, no significant cystocele  or rectocele       Cervix: no lesions/ discharge or friability      Uterus: nl size, contour, position, and mobility (not fixed) , non tender      Adnexa : no masses, tenderness, enlargement or nodularity          Musculoskeletal:        General: No tenderness. Normal range of motion.     Cervical back: Normal range of motion and neck supple. No rigidity. No muscular tenderness.     Right lower leg: No edema.     Left lower leg: No edema.     Comments: No kyphosis   Lymphadenopathy:     Cervical: No cervical adenopathy.  Skin:    General: Skin is warm and dry.     Coloration:  Skin is not pale.     Findings: No erythema or rash.  Neurological:     Mental Status: She is alert. Mental status is at baseline.     Cranial Nerves: No cranial nerve deficit.     Motor: No abnormal muscle tone.     Coordination: Coordination normal.     Gait: Gait normal.     Deep Tendon Reflexes: Reflexes are normal and symmetric. Reflexes normal.  Psychiatric:        Mood and Affect: Mood normal.        Cognition and Memory: Cognition and memory normal.           Assessment & Plan:   Problem List Items Addressed This Visit       Hematopoietic and Hemostatic   Thrombocytopenia (HCC)    Left pl count of 219 this draw Still anemic  No symptoms         Other   Routine general medical examination at a health care facility - Primary    Reviewed health  habits including diet and exercise and skin cancer prevention Reviewed appropriate screening tests for age  Also reviewed health mt list, fam hx and immunization status , as well as social and family history   See HPI Labs reviewed and ordered Mammogram utd 08/2022 Gyn examand pap today Colonoscopy 11/2020 Discussed supplements and exercise for bone health PHQ of 0  Great health habits/improving      HYPERCHOLESTEROLEMIA    Disc goals for lipids and reasons to control them Rev last labs with pt Rev low sat fat diet in detail Well controlled with atorvastatin 5 mg daily   LDL of 74 Better diet Commended       Relevant Medications   atorvastatin (LIPITOR) 10 MG tablet   Encounter for routine gynecological examination    Routine exam No complaints Normal  Pap sent      Relevant Orders   Cytology - PAP([facility])   Elevated glucose    Lab Results  Component Value Date   HGBA1C 6.0 (H) 09/11/2022   disc imp of low glycemic diet and wt loss to prevent DM2  Doing better with habits       Anemia    Lifelong /iron def/ runs in family  No longer menses  Had colonoscopy 2022  Iron and ferritin normal with ferrous sulfate 325 mg daily   Neg for [place] trait  Hb 10.6 No symptoms Will continue to monitor  May need iron inf in the future

## 2022-09-18 NOTE — Assessment & Plan Note (Addendum)
Lifelong /iron def/ runs in family  No longer menses  Had colonoscopy 2022  Iron and ferritin normal with ferrous sulfate 325 mg daily   Neg for [place] trait  Hb 10.6 No symptoms Will continue to monitor  May need iron inf in the future

## 2023-03-02 ENCOUNTER — Ambulatory Visit: Payer: 59

## 2023-08-03 ENCOUNTER — Other Ambulatory Visit: Payer: Self-pay | Admitting: Family Medicine

## 2023-08-03 DIAGNOSIS — Z1231 Encounter for screening mammogram for malignant neoplasm of breast: Secondary | ICD-10-CM

## 2023-08-22 ENCOUNTER — Other Ambulatory Visit: Payer: Self-pay | Admitting: Family Medicine

## 2023-09-10 ENCOUNTER — Telehealth: Payer: Self-pay | Admitting: Family Medicine

## 2023-09-10 DIAGNOSIS — D509 Iron deficiency anemia, unspecified: Secondary | ICD-10-CM

## 2023-09-10 DIAGNOSIS — D696 Thrombocytopenia, unspecified: Secondary | ICD-10-CM

## 2023-09-10 DIAGNOSIS — E7800 Pure hypercholesterolemia, unspecified: Secondary | ICD-10-CM

## 2023-09-10 DIAGNOSIS — Z0000 Encounter for general adult medical examination without abnormal findings: Secondary | ICD-10-CM

## 2023-09-10 DIAGNOSIS — R7309 Other abnormal glucose: Secondary | ICD-10-CM

## 2023-09-10 NOTE — Telephone Encounter (Signed)
-----   Message from Veva JINNY Ferrari sent at 08/21/2023  4:16 PM EDT ----- Regarding: Lab orders for [facility], 7.24.25 Patient is scheduled for CPX labs, please order future labs, Thanks , Veva

## 2023-09-12 DIAGNOSIS — Z1231 Encounter for screening mammogram for malignant neoplasm of breast: Secondary | ICD-10-CM

## 2023-09-13 ENCOUNTER — Other Ambulatory Visit (INDEPENDENT_AMBULATORY_CARE_PROVIDER_SITE_OTHER): Payer: 59

## 2023-09-13 DIAGNOSIS — R7309 Other abnormal glucose: Secondary | ICD-10-CM

## 2023-09-13 DIAGNOSIS — D509 Iron deficiency anemia, unspecified: Secondary | ICD-10-CM

## 2023-09-13 DIAGNOSIS — E7800 Pure hypercholesterolemia, unspecified: Secondary | ICD-10-CM

## 2023-09-13 DIAGNOSIS — D696 Thrombocytopenia, unspecified: Secondary | ICD-10-CM

## 2023-09-13 DIAGNOSIS — Z0000 Encounter for general adult medical examination without abnormal findings: Secondary | ICD-10-CM

## 2023-09-13 NOTE — Addendum Note (Signed)
 Addended by: HOPE VEVA PARAS on: 09/13/2023 08:03 AM   Modules accepted: Orders

## 2023-09-14 ENCOUNTER — Ambulatory Visit: Payer: Self-pay | Admitting: Family Medicine

## 2023-09-14 LAB — CBC WITH DIFFERENTIAL/PLATELET
Basophils Absolute: 0 x10E3/uL (ref 0.0–0.2)
Basos: 0 %
EOS (ABSOLUTE): 0.1 x10E3/uL (ref 0.0–0.4)
Eos: 2 %
Hematocrit: 38.9 % (ref 34.0–46.6)
Hemoglobin: 11.3 g/dL (ref 11.1–15.9)
Immature Grans (Abs): 0 x10E3/uL (ref 0.0–0.1)
Immature Granulocytes: 0 %
Lymphocytes Absolute: 2.4 x10E3/uL (ref 0.7–3.1)
Lymphs: 44 %
MCH: 23.3 pg — ABNORMAL LOW (ref 26.6–33.0)
MCHC: 29 g/dL — ABNORMAL LOW (ref 31.5–35.7)
MCV: 80 fL (ref 79–97)
Monocytes Absolute: 0.3 x10E3/uL (ref 0.1–0.9)
Monocytes: 5 %
Neutrophils Absolute: 2.7 x10E3/uL (ref 1.4–7.0)
Neutrophils: 49 %
Platelets: 182 x10E3/uL (ref 150–450)
RBC: 4.84 x10E6/uL (ref 3.77–5.28)
RDW: 14.2 % (ref 11.7–15.4)
WBC: 5.5 x10E3/uL (ref 3.4–10.8)

## 2023-09-14 LAB — COMPREHENSIVE METABOLIC PANEL WITH GFR
ALT: 12 IU/L (ref 0–32)
AST: 25 IU/L (ref 0–40)
Albumin: 4.5 g/dL (ref 3.8–4.9)
Alkaline Phosphatase: 69 IU/L (ref 44–121)
BUN/Creatinine Ratio: 13 (ref 9–23)
BUN: 11 mg/dL (ref 6–24)
Bilirubin Total: 0.9 mg/dL (ref 0.0–1.2)
CO2: 23 mmol/L (ref 20–29)
Calcium: 9.3 mg/dL (ref 8.7–10.2)
Chloride: 104 mmol/L (ref 96–106)
Creatinine, Ser: 0.86 mg/dL (ref 0.57–1.00)
Globulin, Total: 2.7 g/dL (ref 1.5–4.5)
Glucose: 100 mg/dL — ABNORMAL HIGH (ref 70–99)
Potassium: 4.4 mmol/L (ref 3.5–5.2)
Sodium: 141 mmol/L (ref 134–144)
Total Protein: 7.2 g/dL (ref 6.0–8.5)
eGFR: 78 mL/min/1.73 (ref >59)

## 2023-09-14 LAB — LIPID PANEL
Chol/HDL Ratio: 2.8 ratio (ref 0.0–4.4)
Cholesterol, Total: 173 mg/dL (ref 100–199)
HDL: 62 mg/dL (ref >39)
LDL Chol Calc (NIH): 92 mg/dL (ref 0–99)
Triglycerides: 105 mg/dL (ref 0–149)
VLDL Cholesterol Cal: 19 mg/dL (ref 5–40)

## 2023-09-14 LAB — FERRITIN: Ferritin: 72 ng/mL (ref 15–150)

## 2023-09-14 LAB — HEMOGLOBIN A1C
Est. average glucose Bld gHb Est-mCnc: 120 mg/dL
Hgb A1c MFr Bld: 5.8 % — ABNORMAL HIGH (ref 4.8–5.6)

## 2023-09-14 LAB — TSH: TSH: 1.67 u[IU]/mL (ref 0.450–4.500)

## 2023-09-14 LAB — IRON: Iron: 66 ug/dL (ref 27–159)

## 2023-09-20 ENCOUNTER — Encounter: Payer: 59 | Admitting: Family Medicine

## 2023-09-26 ENCOUNTER — Ambulatory Visit
Admission: RE | Admit: 2023-09-26 | Discharge: 2023-09-26 | Disposition: A | Discharge status: Home / Self Care | Source: Ambulatory Visit | Attending: Family Medicine | Admitting: Family Medicine

## 2023-09-26 DIAGNOSIS — Z1231 Encounter for screening mammogram for malignant neoplasm of breast: Secondary | ICD-10-CM

## 2023-09-30 ENCOUNTER — Ambulatory Visit: Payer: Self-pay | Admitting: Family Medicine

## 2023-10-02 ENCOUNTER — Encounter: Payer: Self-pay | Admitting: Family Medicine

## 2023-10-02 ENCOUNTER — Ambulatory Visit (INDEPENDENT_AMBULATORY_CARE_PROVIDER_SITE_OTHER): Admitting: Family Medicine

## 2023-10-02 VITALS — BP 116/78 | HR 62 | Temp 98.4°F | Ht 66.5 in | Wt 172.5 lb

## 2023-10-02 DIAGNOSIS — D509 Iron deficiency anemia, unspecified: Secondary | ICD-10-CM

## 2023-10-02 DIAGNOSIS — E7800 Pure hypercholesterolemia, unspecified: Secondary | ICD-10-CM

## 2023-10-02 DIAGNOSIS — Z1211 Encounter for screening for malignant neoplasm of colon: Secondary | ICD-10-CM

## 2023-10-02 DIAGNOSIS — D696 Thrombocytopenia, unspecified: Secondary | ICD-10-CM

## 2023-10-02 DIAGNOSIS — R7303 Prediabetes: Secondary | ICD-10-CM

## 2023-10-02 DIAGNOSIS — Z0000 Encounter for general adult medical examination without abnormal findings: Secondary | ICD-10-CM

## 2023-10-02 NOTE — Assessment & Plan Note (Signed)
 Prediabetes  Lab Results  Component Value Date   HGBA1C 5.8 (H) 09/13/2023   HGBA1C 6.0 (H) 09/11/2022   HGBA1C 6.2 (H) 02/06/2022    disc imp of low glycemic diet and wt loss to prevent DM2  Commended work on diet and exercise

## 2023-10-02 NOTE — Assessment & Plan Note (Signed)
 Reviewed health habits including diet and exercise and skin cancer prevention Reviewed appropriate screening tests for age  Also reviewed health mt list, fam hx and immunization status , as well as social and family history   See HPI Labs reviewed and ordered Health Maintenance  Topic Date Due   Hepatitis B Vaccine (1 of 3 - 19+ 3-dose series) Never done   Pneumococcal Vaccine for age over 30 (1 of 1 - PCV) Never done   COVID-19 Vaccine (5 - 2024-25 season) 10/22/2022   Flu Shot  05/20/2024*   Mammogram  09/25/2024   DTaP/Tdap/Td vaccine (3 - Td or Tdap) 05/17/2025   Pap with HPV screening  09/18/2027   Colon Cancer Screening  12/09/2030   Hepatitis C Screening  Completed   HIV Screening  Completed   Zoster (Shingles) Vaccine  Completed   HPV Vaccine  Aged Out   Meningitis B Vaccine  Aged Out  *Topic was postponed. The date shown is not the original due date.   Plans to get covid and flu shots in the fall  Discussed fall prevention, supplements and exercise for bone density  Encouraged more strength building exercise  PHQ 0 Labs reviewed

## 2023-10-02 NOTE — Assessment & Plan Note (Signed)
 Baseline  No longer having menses   Lab Results  Component Value Date   WBC 5.5 09/13/2023   HGB 11.3 09/13/2023   HCT 38.9 09/13/2023   MCV 80 09/13/2023   PLT 182 09/13/2023   Lab Results  Component Value Date   IRON  66 09/13/2023   FERRITIN 72 09/13/2023

## 2023-10-02 NOTE — Assessment & Plan Note (Signed)
 Plt count normal today  Lab Results  Component Value Date   WBC 5.5 09/13/2023   HGB 11.3 09/13/2023   HCT 38.9 09/13/2023   MCV 80 09/13/2023   PLT 182 09/13/2023

## 2023-10-02 NOTE — Assessment & Plan Note (Signed)
 Disc goals for lipids and reasons to control them Rev last labs with pt Rev low sat fat diet in detail   Eating well Atorvastatin  5 mg daily  LDL up a bit to 92 but still well controlled

## 2023-10-02 NOTE — Patient Instructions (Addendum)
 Stay active  Keep walking  Add some strength training to your routine, this is important for bone and brain health and can reduce your risk of falls and help your body use insulin properly and regulate weight  Light weights, exercise bands , and internet videos are a good way to start  Yoga (chair or regular), machines , floor exercises or a gym with machines are also good options   Keep getting covid and flu vaccine yearly in the fall    Take care of yourself

## 2023-10-02 NOTE — Assessment & Plan Note (Signed)
 Colonoscopy 2022 with 10 y recall

## 2023-10-02 NOTE — Progress Notes (Signed)
 Subjective:    Patient ID: Leslie Gomez, female    DOB: [birth year], 58 y.o.   MRN: [id number]  HPI  Here for health maintenance exam and to review chronic medical problems   Wt Readings from Last 3 Encounters:  10/02/23 172 lb 8 oz (78.2 kg)  09/18/22 171 lb (77.6 kg)  09/13/21 167 lb 12.8 oz (76.1 kg)   27.43 kg/m  Vitals:   10/02/23 0824  BP: 116/78  Pulse: 62  Temp: 98.4 F (36.9 C)  SpO2: 100%    Immunization History  Administered Date(s) Administered   Influenza Inj Mdck Quad Pf 11/21/2018   Influenza Split 12/21/2010   Influenza Whole 12/20/2006, 11/21/2007, 11/30/2008, 12/01/2009   Influenza-Unspecified 11/26/2020, 11/18/2021   Measles 03/20/1987   PFIZER(Purple Top)SARS-COV-2 Vaccination 03/28/2019, 04/18/2019, 12/26/2019, 10/23/2020   Td 10/26/2005   Tdap 05/18/2015   Zoster Recombinant(Shingrix) 10/08/2020, 12/10/2020    Health Maintenance Due  Topic Date Due   Hepatitis B Vaccines (1 of 3 - 19+ 3-dose series) Never done   Pneumococcal Vaccine: 50+ Years (1 of 1 - PCV) Never done   COVID-19 Vaccine (5 - 2024-25 season) 10/22/2022   Went to [place] -loved it  Family moved out  [place] into a new apt with a cat  Thrilled with it   Did get covid in January- one bad day   Mammogram 09/2023  Self breast exam- no lumps   Gyn health Menstrual status - post menopausal / no menop symptoms  Pap 08/2022 -normal with neg HPV    Colon cancer screening  colonoscopy 11/2020 with 10 y recall   Bone health   Falls-none  Fractures-none Supplements -vitamin D daily   Exercise  Walking 5 miles per day  Tries to do some strength training    Mood    10/02/2023    9:12 AM 09/18/2022    8:25 AM 09/13/2021    8:19 AM 09/07/2020    4:44 PM 09/02/2019    4:01 PM  Depression screen PHQ 2/9  Decreased Interest 0 0 0 0 0  Down, Depressed, Hopeless 0 0 0 0 0  PHQ - 2 Score 0 0 0 0 0  Altered sleeping 0 0  0 0  Tired, decreased energy 0 0  0 0  Change  in appetite 0 0  0 0  Feeling bad or failure about yourself  0 0  0 0  Trouble concentrating 0 0  0 0  Moving slowly or fidgety/restless 0 0  0 0  Suicidal thoughts 0 0  0 0  PHQ-9 Score 0 0  0 0  Difficult doing work/chores Not difficult at all Not difficult at all  Not difficult at all Not difficult at all   Hyperlipidemia Lab Results  Component Value Date   CHOL 173 09/13/2023   CHOL 155 09/11/2022   CHOL 216 (H) 02/06/2022   Lab Results  Component Value Date   HDL 62 09/13/2023   HDL 69 09/11/2022   HDL 70 02/06/2022   Lab Results  Component Value Date   LDLCALC 92 09/13/2023   LDLCALC 74 09/11/2022   LDLCALC 133 (H) 02/06/2022   Lab Results  Component Value Date   TRIG 105 09/13/2023   TRIG 60 09/11/2022   TRIG 76 02/06/2022   Lab Results  Component Value Date   CHOLHDL 2.8 09/13/2023   CHOLHDL 2.2 09/11/2022   CHOLHDL 3.1 02/06/2022   No results found for: LDLDIRECT LDL up a bit from a  year ago  Takes atorvastatin  5 mg daily   Diet is usually good  Except on vacation   Glucose in prediabetic range  Lab Results  Component Value Date   HGBA1C 5.8 (H) 09/13/2023   HGBA1C 6.0 (H) 09/11/2022   HGBA1C 6.2 (H) 02/06/2022  Trying to cut out almost all sugar    History of anemia and low plt in past  Lab Results  Component Value Date   WBC 5.5 09/13/2023   HGB 11.3 09/13/2023   HCT 38.9 09/13/2023   MCV 80 09/13/2023   PLT 182 09/13/2023   Lab Results  Component Value Date   IRON  66 09/13/2023   FERRITIN 72 09/13/2023   Lab Results  Component Value Date   NA 141 09/13/2023   K 4.4 09/13/2023   CO2 23 09/13/2023   GLUCOSE 100 (H) 09/13/2023   BUN 11 09/13/2023   CREATININE 0.86 09/13/2023   CALCIUM  9.3 09/13/2023   EGFR 78 09/13/2023   GFRNONAA 94 08/29/2019   Lab Results  Component Value Date   ALT 12 09/13/2023   AST 25 09/13/2023   ALKPHOS 69 09/13/2023   BILITOT 0.9 09/13/2023       Patient Active Problem List   Diagnosis  Date Noted   Thrombocytopenia (HCC) 09/02/2019   Prediabetes 08/27/2017   Colon cancer screening 05/18/2015   Encounter for routine gynecological examination 01/17/2011   Routine general medical examination at a health care facility 01/09/2011   HYPERCHOLESTEROLEMIA 11/19/2006   Anemia 11/19/2006   Past Medical History:  Diagnosis Date   Anemia    NOS since childhood runs in family   Hyperlipidemia    Past Surgical History:  Procedure Laterality Date   ABDOMINOPLASTY  2018   CESAREAN SECTION     COSMETIC SURGERY     WISDOM TOOTH EXTRACTION     Social History   Tobacco Use   Smoking status: Never   Smokeless tobacco: Never  Vaping Use   Vaping status: Never Used  Substance Use Topics   Alcohol use: No   Drug use: No   Family History  Problem Relation Age of Onset   Hyperlipidemia Mother    Hypertension Mother    Anemia Mother    Stomach cancer Father    Cancer Father        stomach CA   Colon cancer Neg Hx    Breast cancer Neg Hx    Esophageal cancer Neg Hx    Rectal cancer Neg Hx    No Known Allergies Current Outpatient Medications on File Prior to Visit  Medication Sig Dispense Refill   atorvastatin  (LIPITOR) 10 MG tablet TAKE ONE-HALF TABLET BY MOUTH  DAILY 45 tablet 0   ferrous sulfate  325 (65 FE) MG tablet Take 1 tablet (325 mg total) by mouth daily. 1 tablet 0   No current facility-administered medications on file prior to visit.    Review of Systems  Constitutional:  Negative for activity change, appetite change, fatigue, fever and unexpected weight change.  HENT:  Negative for congestion, ear pain, rhinorrhea, sinus pressure and sore throat.   Eyes:  Negative for pain, redness and visual disturbance.  Respiratory:  Negative for cough, shortness of breath and wheezing.   Cardiovascular:  Negative for chest pain and palpitations.  Gastrointestinal:  Negative for abdominal pain, blood in stool, constipation and diarrhea.  Endocrine: Negative for  polydipsia and polyuria.  Genitourinary:  Negative for dysuria, frequency and urgency.  Musculoskeletal:  Negative for arthralgias, back  pain and myalgias.  Skin:  Negative for pallor and rash.  Allergic/Immunologic: Negative for environmental allergies.  Neurological:  Negative for dizziness, syncope and headaches.  Hematological:  Negative for adenopathy. Does not bruise/bleed easily.  Psychiatric/Behavioral:  Negative for decreased concentration and dysphoric mood. The patient is not nervous/anxious.        Objective:   Physical Exam Constitutional:      General: She is not in acute distress.    Appearance: Normal appearance. She is well-developed and normal weight. She is not ill-appearing or diaphoretic.  HENT:     Head: Normocephalic and atraumatic.     Right Ear: Tympanic membrane, ear canal and external ear normal.     Left Ear: Tympanic membrane, ear canal and external ear normal.     Nose: Nose normal. No congestion.     Mouth/Throat:     Mouth: Mucous membranes are moist.     Pharynx: Oropharynx is clear. No posterior oropharyngeal erythema.  Eyes:     General: No scleral icterus.    Extraocular Movements: Extraocular movements intact.     Conjunctiva/sclera: Conjunctivae normal.     Pupils: Pupils are equal, round, and reactive to light.  Neck:     Thyroid: No thyromegaly.     Vascular: No carotid bruit or JVD.  Cardiovascular:     Rate and Rhythm: Normal rate and regular rhythm.     Pulses: Normal pulses.     Heart sounds: Normal heart sounds.     No gallop.  Pulmonary:     Effort: Pulmonary effort is normal. No respiratory distress.     Breath sounds: Normal breath sounds. No wheezing.     Comments: Good air exch Chest:     Chest wall: No tenderness.  Abdominal:     General: Bowel sounds are normal. There is no distension or abdominal bruit.     Palpations: Abdomen is soft. There is no mass.     Tenderness: There is no abdominal tenderness.     Hernia: No  hernia is present.  Genitourinary:    Comments: Breast exam: No mass, nodules, thickening, tenderness, bulging, retraction, inflamation, nipple discharge or skin changes noted.  No axillary or clavicular LA.     Musculoskeletal:        General: No tenderness. Normal range of motion.     Cervical back: Normal range of motion and neck supple. No rigidity. No muscular tenderness.     Right lower leg: No edema.     Left lower leg: No edema.     Comments: No kyphosis   Lymphadenopathy:     Cervical: No cervical adenopathy.  Skin:    General: Skin is warm and dry.     Coloration: Skin is not pale.     Findings: No erythema or rash.     Comments: Few skin tags   Neurological:     Mental Status: She is alert. Mental status is at baseline.     Cranial Nerves: No cranial nerve deficit.     Motor: No abnormal muscle tone.     Coordination: Coordination normal.     Gait: Gait normal.     Deep Tendon Reflexes: Reflexes are normal and symmetric. Reflexes normal.  Psychiatric:        Mood and Affect: Mood normal.        Cognition and Memory: Cognition and memory normal.           Assessment & Plan:   Problem List Items  Addressed This Visit       Hematopoietic and Hemostatic   Thrombocytopenia (HCC)   Plt count normal today  Lab Results  Component Value Date   WBC 5.5 09/13/2023   HGB 11.3 09/13/2023   HCT 38.9 09/13/2023   MCV 80 09/13/2023   PLT 182 09/13/2023           Other   Routine general medical examination at a health care facility - Primary   Reviewed health habits including diet and exercise and skin cancer prevention Reviewed appropriate screening tests for age  Also reviewed health mt list, fam hx and immunization status , as well as social and family history   See HPI Labs reviewed and ordered Health Maintenance  Topic Date Due   Hepatitis B Vaccine (1 of 3 - 19+ 3-dose series) Never done   Pneumococcal Vaccine for age over 54 (1 of 1 - PCV) Never done    COVID-19 Vaccine (5 - 2024-25 season) 10/22/2022   Flu Shot  05/20/2024*   Mammogram  09/25/2024   DTaP/Tdap/Td vaccine (3 - Td or Tdap) 05/17/2025   Pap with HPV screening  09/18/2027   Colon Cancer Screening  12/09/2030   Hepatitis C Screening  Completed   HIV Screening  Completed   Zoster (Shingles) Vaccine  Completed   HPV Vaccine  Aged Out   Meningitis B Vaccine  Aged Out  *Topic was postponed. The date shown is not the original due date.   Plans to get covid and flu shots in the fall  Discussed fall prevention, supplements and exercise for bone density  Encouraged more strength building exercise  PHQ 0 Labs reviewed        Prediabetes   Prediabetes  Lab Results  Component Value Date   HGBA1C 5.8 (H) 09/13/2023   HGBA1C 6.0 (H) 09/11/2022   HGBA1C 6.2 (H) 02/06/2022    disc imp of low glycemic diet and wt loss to prevent DM2  Commended work on diet and exercise       HYPERCHOLESTEROLEMIA   Disc goals for lipids and reasons to control them Rev last labs with pt Rev low sat fat diet in detail   Eating well Atorvastatin  5 mg daily  LDL up a bit to 92 but still well controlled       Colon cancer screening   Colonoscopy 2022 with 10 y recall      Anemia   Baseline  No longer having menses   Lab Results  Component Value Date   WBC 5.5 09/13/2023   HGB 11.3 09/13/2023   HCT 38.9 09/13/2023   MCV 80 09/13/2023   PLT 182 09/13/2023   Lab Results  Component Value Date   IRON  66 09/13/2023   FERRITIN 72 09/13/2023

## 2023-10-29 ENCOUNTER — Other Ambulatory Visit: Payer: Self-pay | Admitting: Family Medicine

## 2023-12-02 ENCOUNTER — Emergency Department
Admission: EM | Admit: 2023-12-02 | Discharge: 2023-12-02 | Disposition: A | Discharge status: Home / Self Care | Attending: Emergency Medicine | Admitting: Emergency Medicine

## 2023-12-02 ENCOUNTER — Emergency Department

## 2023-12-02 ENCOUNTER — Other Ambulatory Visit: Payer: Self-pay

## 2023-12-02 DIAGNOSIS — M542 Cervicalgia: Secondary | ICD-10-CM | POA: Insufficient documentation

## 2023-12-02 DIAGNOSIS — S20229A Contusion of unspecified back wall of thorax, initial encounter: Secondary | ICD-10-CM | POA: Diagnosis not present

## 2023-12-02 DIAGNOSIS — W109XXA Fall (on) (from) unspecified stairs and steps, initial encounter: Secondary | ICD-10-CM | POA: Insufficient documentation

## 2023-12-02 DIAGNOSIS — S0990XA Unspecified injury of head, initial encounter: Secondary | ICD-10-CM | POA: Insufficient documentation

## 2023-12-02 MED ORDER — ACETAMINOPHEN 325 MG PO TABS
650.0000 mg | ORAL_TABLET | Freq: Once | ORAL | Status: AC
  Administered 2023-12-02: 650 mg via ORAL
  Filled 2023-12-02: qty 2

## 2023-12-02 MED ORDER — LIDOCAINE 5 % EX PTCH
1.0000 | MEDICATED_PATCH | CUTANEOUS | Status: DC
  Administered 2023-12-02: 1 via TRANSDERMAL
  Filled 2023-12-02: qty 1

## 2023-12-02 NOTE — ED Provider Notes (Signed)
 [facility] Provider Note    Event Date/Time   First MD Initiated Contact with Patient 12/02/23 1027     (approximate)   History   Fall   HPI  Leslie Gomez is a 58 y.o. female who presents today for evaluation of headache and back pain and neck pain after a slip and fall down the stairs yesterday.  Patient reports that she hit her head and her back on the stairs and has had pain ever since.  She reports intermittent paresthesias in her right arm.  No weakness in her arm.  She is still able to ambulate.  No lower extremity symptoms.  She has not had any nausea or vomiting.  She is not anticoagulated.  She has not had any visual changes.  Patient Active Problem List   Diagnosis Date Noted   Thrombocytopenia 09/02/2019   Prediabetes 08/27/2017   Colon cancer screening 05/18/2015   Encounter for routine gynecological examination 01/17/2011   Routine general medical examination at a health care facility 01/09/2011   HYPERCHOLESTEROLEMIA 11/19/2006   Anemia 11/19/2006          Physical Exam   Triage Vital Signs: ED Triage Vitals  Encounter Vitals Group     BP 12/02/23 1012 (!) 163/60     Girls Systolic BP Percentile --      Girls Diastolic BP Percentile --      Boys Systolic BP Percentile --      Boys Diastolic BP Percentile --      Pulse Rate 12/02/23 1012 (!) 55     Resp 12/02/23 1012 18     Temp 12/02/23 1012 98 F (36.7 C)     Temp Source 12/02/23 1012 Oral     SpO2 12/02/23 1012 100 %     Weight 12/02/23 1013 170 lb (77.1 kg)     Height 12/02/23 1013 5' 6 (1.676 m)     Head Circumference --      Peak Flow --      Pain Score 12/02/23 1012 10     Pain Loc --      Pain Education --      Exclude from Growth Chart --     Most recent vital signs: Vitals:   12/02/23 1012  BP: (!) 163/60  Pulse: (!) 55  Resp: 18  Temp: 98 F (36.7 C)  SpO2: 100%    Physical Exam Vitals and nursing note reviewed.  Constitutional:       General: Awake and alert. No acute distress.    Appearance: Normal appearance. The patient is normal weight.  HENT:     Head: Normocephalic and atraumatic.     Mouth: Mucous membranes are moist.  Eyes:     General: PERRL. Normal EOMs        Right eye: No discharge.        Left eye: No discharge.     Conjunctiva/sclera: Conjunctivae normal.  Cardiovascular:     Rate and Rhythm: Normal rate and regular rhythm.     Pulses: Normal pulses.  Pulmonary:     Effort: Pulmonary effort is normal. No respiratory distress.     Breath sounds: Normal breath sounds.  Abdominal:     Abdomen is soft. There is no abdominal tenderness. No rebound or guarding. No distention. Musculoskeletal:        General: No swelling. Normal range of motion.     Cervical back: Normal range of motion and neck supple. No midline  cervical spine tenderness.  Full range of motion of neck.  Negative Spurling test.  Negative Lhermitte sign.  Normal strength and sensation in bilateral upper extremities. Normal grip strength bilaterally.  Normal intrinsic muscle function of the hand bilaterally.  Normal radial pulses bilaterally. Diffuse tenderness to thoracic and lumbar spine, both midline and paraspinally. Strength and sensation 5/5 to bilateral lower extremities. Normal great toe extension against resistance. Normal sensation throughout feet. Normal patellar reflexes. Negative SLR and opposite SLR bilaterally. Negative FABER test Skin:    General: Skin is warm and dry.     Capillary Refill: Capillary refill takes less than 2 seconds.     Findings: No rash.  Neurological:     Mental Status: The patient is awake and alert.    Neurological: GCS 15 alert and oriented x3 Normal speech, no expressive or receptive aphasia or dysarthria Cranial nerves II through XII intact Normal visual fields 5 out of 5 strength in all 4 extremities with intact sensation throughout No extremity drift Normal finger-to-nose testing, no limb or  truncal ataxia   ED Results / Procedures / Treatments   Labs (all labs ordered are listed, but only abnormal results are displayed) Labs Reviewed - No data to display   EKG     RADIOLOGY I independently reviewed and interpreted imaging and agree with radiologists findings.     PROCEDURES:  Critical Care performed:   Procedures   MEDICATIONS ORDERED IN ED: Medications  acetaminophen (TYLENOL) tablet 650 mg (650 mg Oral Given 12/02/23 1050)     IMPRESSION / MDM / ASSESSMENT AND PLAN / ED COURSE  I reviewed the triage vital signs and the nursing notes.   Differential diagnosis includes, but is not limited to,  concussion, contusion, vertebral fracture, intracranial hemorrhage.  Patient is awake and alert, hemodynamically stable and afebrile.  She has normal strength and sensation bilateral upper extremities, do not suspect central cord syndrome.  She does have diffuse cervical, thoracic, and lumbar tenderness, primarily paraspinally, though she does endorse midline tenderness as well.  Therefore, CT scans obtained for further evaluation.  These were negative for any acute findings.  Upon reevaluation, patient reports that she feels significantly improved and ready for discharge home.  We discussed return precautions and outpatient follow-up.  Patient understands and agrees with plan.  Discharged in stable condition.  Patient's presentation is most consistent with acute complicated illness / injury requiring diagnostic workup.    FINAL CLINICAL IMPRESSION(S) / ED DIAGNOSES   Final diagnoses:  Injury of head, initial encounter  Contusion of back, unspecified laterality, initial encounter     Rx / DC Orders   ED Discharge Orders     None        Note:  This document was prepared using Dragon voice recognition software and may include unintentional dictation errors.   Vasti Yagi E, PA-C 12/02/23 1659    Levander Slate, MD 12/03/23 2255

## 2023-12-02 NOTE — ED Triage Notes (Signed)
 Pt c/o right arm tingling, back pain, and headache falling a mechanical fall yesterday morning. Pt states back pain occurred immediately following the fall, however headache did not start until today.

## 2023-12-02 NOTE — Discharge Instructions (Signed)
 Your CT scan were normal.  Please follow-up with your outpatient provider.  Please return for any new, worsening, or changing symptoms or other concerns.  It was a pleasure caring for you today

## 2023-12-02 NOTE — ED Notes (Signed)
 See triage note Presents with headache and back pain  States she fell yesterday

## 2023-12-03 ENCOUNTER — Inpatient Hospital Stay: Admitting: Family Medicine

## 2023-12-05 ENCOUNTER — Ambulatory Visit: Admitting: Family Medicine

## 2023-12-05 ENCOUNTER — Encounter: Payer: Self-pay | Admitting: Family Medicine

## 2023-12-05 VITALS — BP 130/86 | HR 62 | Temp 97.7°F | Ht 66.0 in | Wt 170.4 lb

## 2023-12-05 DIAGNOSIS — S060X0D Concussion without loss of consciousness, subsequent encounter: Secondary | ICD-10-CM | POA: Diagnosis not present

## 2023-12-05 DIAGNOSIS — S060XAA Concussion with loss of consciousness status unknown, initial encounter: Secondary | ICD-10-CM | POA: Insufficient documentation

## 2023-12-05 DIAGNOSIS — Z9181 History of falling: Secondary | ICD-10-CM

## 2023-12-05 DIAGNOSIS — M549 Dorsalgia, unspecified: Secondary | ICD-10-CM | POA: Diagnosis not present

## 2023-12-05 MED ORDER — METHOCARBAMOL 500 MG PO TABS: 500.0000 mg | ORAL_TABLET | Freq: Three times a day (TID) | ORAL | 1 refills | Status: AC | PRN

## 2023-12-05 NOTE — Assessment & Plan Note (Addendum)
 S/p posterior head injury from fall Reviewed hospital records, lab results and studies in detail   Reassuring CT head  Reassuring exam   Primary symptoms are headache (improved) , tinnitus and slowed concentration  Discussed taking time off for brain rest  Outlined this in detail, see AVS  Handout given re: head inj and concussion   Will rest from work/reading/ screens to speed recovery   Reviewed signs and symptoms of subdural hematoma to watch for closely   Call back and Er precautions noted in detail today   Follow up next wk for re check   Will take out of work for a week  Paperwork done

## 2023-12-05 NOTE — Progress Notes (Signed)
 Subjective:    Patient ID: Leslie Gomez, female    DOB: [birth year], 58 y.o.   MRN: [id number]  HPI  Wt Readings from Last 3 Encounters:  12/05/23 170 lb 6 oz (77.3 kg)  12/02/23 170 lb (77.1 kg)  10/02/23 172 lb 8 oz (78.2 kg)   27.50 kg/m  Vitals:   12/05/23 1026  BP: 130/86  Pulse: 62  Temp: 97.7 F (36.5 C)  SpO2: 100%   Here for ER follow up   Seen on 10/12 at [facility] For headache/ neck pain and back pain after slip and fall down the stairs day prior  Hit head on back of staris  Noted some paresthesias in right arm   Pt says she was going down steps at home to go for her walk  Slipped backwards/landed on back and head  Was able to get up on her own  Took it easy  Next day she woke up with severe headache    Treated with lidocaine patch and acetaminophen  CT head, CS, TS and LS done   CT Lumbar Spine Wo Contrast Result Date: 12/02/2023 EXAM: CT OF THE LUMBAR SPINE WITHOUT CONTRAST 12/02/2023 11:06:35 AM TECHNIQUE: CT of the lumbar spine was performed without the administration of intravenous contrast. Multiplanar reformatted images are provided for review. Automated exposure control, iterative reconstruction, and/or weight based adjustment of the mA/kV was utilized to reduce the radiation dose to as low as reasonably achievable. COMPARISON: None available. CLINICAL HISTORY: Back trauma, no prior imaging. Patient complains of right arm tingling, back pain, and headache following a mechanical fall yesterday morning. Back pain occurred immediately, while headache started today. FINDINGS: BONES AND ALIGNMENT: Normal vertebral body heights. No acute fracture or suspicious bone lesion. Normal alignment. DEGENERATIVE CHANGES: Mild facet degenerative change is present at L4-L5 and L5-S1 without significant disc protrusion or stenosis at either level. SOFT TISSUES: No acute abnormality. IMPRESSION: 1. No acute findings. Electronically signed by: Lonni Necessary MD  12/02/2023 11:32 AM EDT RP Workstation: [id number]   CT Thoracic Spine Wo Contrast Result Date: 12/02/2023 EXAM: CT THORACIC SPINE WITHOUT CONTRAST 12/02/2023 11:06:35 AM TECHNIQUE: CT of the thoracic spine was performed without the administration of intravenous contrast. Multiplanar reformatted images are provided for review. Automated exposure control, iterative reconstruction, and/or weight based adjustment of the mA/kV was utilized to reduce the radiation dose to as low as reasonably achievable. COMPARISON: None available. CLINICAL HISTORY: Back trauma, no prior imaging (Age >= 16y). Patient complains of right arm tingling, back pain, and headache following a mechanical fall yesterday morning. Patient states back pain occurred immediately following the fall, however headache did not start until today. FINDINGS: BONES AND ALIGNMENT: Normal vertebral body heights. No acute fracture or suspicious bone lesion. Mild rightward curvature centered at T7-T8. No significant listhesis is present. DEGENERATIVE CHANGES: No significant degenerative changes. SOFT TISSUES: No acute abnormality. IMPRESSION: 1. No acute thoracic spine abnormality related to back trauma. Electronically signed by: Lonni Necessary MD 12/02/2023 11:30 AM EDT RP Workstation: [id number]   CT Cervical Spine Wo Contrast Result Date: 12/02/2023 EXAM: CT CERVICAL SPINE WITHOUT CONTRAST 12/02/2023 11:06:35 AM TECHNIQUE: CT of the cervical spine was performed without the administration of intravenous contrast. Multiplanar reformatted images are provided for review. Automated exposure control, iterative reconstruction, and/or weight based adjustment of the mA/kV was utilized to reduce the radiation dose to as low as reasonably achievable. COMPARISON: None available. CLINICAL HISTORY: Neck trauma, dangerous injury mechanism (Age 79-64y). Pt c/o right arm tingling,  back pain, and headache falling a mechanical fall yesterday morning. Pt states back  pain occurred immediately following the fall, however headache did not start until today. FINDINGS: CERVICAL SPINE: BONES AND ALIGNMENT: Slight reversal of the normal cervical lordosis is present. No acute fracture or traumatic malalignment. DEGENERATIVE CHANGES: Endplate change and uncovertebral spurring is greatest at C5-C6. Mild left foraminal narrowing is present at C5-C6. SOFT TISSUES: No prevertebral soft tissue swelling. IMPRESSION: 1. No acute abnormality of the cervical spine related to the reported trauma. 2. Slight reversal of the normal cervical lordosis. Electronically signed by: Lonni Necessary MD 12/02/2023 11:28 AM EDT RP Workstation: [id number]   CT Head Wo Contrast Result Date: 12/02/2023 EXAM: CT HEAD WITHOUT CONTRAST 12/02/2023 11:06:35 AM TECHNIQUE: CT of the head was performed without the administration of intravenous contrast. Automated exposure control, iterative reconstruction, and/or weight based adjustment of the mA/kV was utilized to reduce the radiation dose to as low as reasonably achievable. COMPARISON: None available. CLINICAL HISTORY: Head trauma, moderate-severe, following a mechanical fall yesterday morning. Patient complains of right arm tingling, back pain, and headache. The headache started today. FINDINGS: BRAIN AND VENTRICLES: No acute hemorrhage. No evidence of acute infarct. No hydrocephalus. No extra-axial collection. No mass effect or midline shift. ORBITS: No acute abnormality. SINUSES: No acute abnormality. SOFT TISSUES AND SKULL: No acute soft tissue abnormality. No skull fracture. IMPRESSION: 1. No acute intracranial abnormality. Electronically signed by: Lonni Necessary MD 12/02/2023 11:23 AM EDT RP Workstation: [id number]     Today  Headache is improved but still present  Dull and not severe Ringing in her ears -is driving her crazy/ hard to hear  A little off balance in am/ that is better  No nausea or vomiting  No confusion  Some trouble  concentrating   Takes aleve for pain   Out of work since 12/03/23      Patient Active Problem List   Diagnosis Date Noted   Concussion 12/05/2023   Back pain 12/05/2023   History of fall 12/05/2023   Thrombocytopenia 09/02/2019   Prediabetes 08/27/2017   Colon cancer screening 05/18/2015   Encounter for routine gynecological examination 01/17/2011   Routine general medical examination at a health care facility 01/09/2011   HYPERCHOLESTEROLEMIA 11/19/2006   Anemia 11/19/2006   Past Medical History:  Diagnosis Date   Anemia    NOS since childhood runs in family   Hyperlipidemia    Past Surgical History:  Procedure Laterality Date   ABDOMINOPLASTY  2018   CESAREAN SECTION     COSMETIC SURGERY     WISDOM TOOTH EXTRACTION     Social History   Tobacco Use   Smoking status: Never   Smokeless tobacco: Never  Vaping Use   Vaping status: Never Used  Substance Use Topics   Alcohol use: No   Drug use: No   Family History  Problem Relation Age of Onset   Hyperlipidemia Mother    Hypertension Mother    Anemia Mother    Stomach cancer Father    Cancer Father        stomach CA   Colon cancer Neg Hx    Breast cancer Neg Hx    Esophageal cancer Neg Hx    Rectal cancer Neg Hx    No Known Allergies Current Outpatient Medications on File Prior to Visit  Medication Sig Dispense Refill   atorvastatin  (LIPITOR) 10 MG tablet TAKE ONE-HALF TABLET BY MOUTH  DAILY 45 tablet 3   ferrous sulfate  325 (65  FE) MG tablet Take 1 tablet (325 mg total) by mouth daily. 1 tablet 0   No current facility-administered medications on file prior to visit.    Review of Systems  Constitutional:  Positive for fatigue.  Musculoskeletal:  Positive for back pain and myalgias.       Very stiff and sore from neck down to back   Neurological:  Positive for headaches. Negative for dizziness, tremors, seizures, syncope, facial asymmetry, speech difficulty, weakness, light-headedness and numbness.   Psychiatric/Behavioral:  Positive for decreased concentration. Negative for confusion.        Objective:   Physical Exam Constitutional:      General: She is not in acute distress.    Appearance: Normal appearance. She is well-developed and normal weight. She is not ill-appearing or diaphoretic.  HENT:     Head: Normocephalic and atraumatic.     Right Ear: Tympanic membrane, ear canal and external ear normal.     Left Ear: Tympanic membrane, ear canal and external ear normal.     Nose: Nose normal.     Mouth/Throat:     Pharynx: No oropharyngeal exudate.  Eyes:     General: No scleral icterus.       Right eye: No discharge.        Left eye: No discharge.     Conjunctiva/sclera: Conjunctivae normal.     Pupils: Pupils are equal, round, and reactive to light.     Comments: No nystagmus  Neck:     Thyroid: No thyromegaly.     Vascular: No carotid bruit or JVD.     Trachea: No tracheal deviation.     Comments: Neck musculature is tender  Rom causes pain but able to do  Cardiovascular:     Rate and Rhythm: Normal rate and regular rhythm.     Heart sounds: Normal heart sounds. No murmur heard. Pulmonary:     Effort: Pulmonary effort is normal. No respiratory distress.     Breath sounds: Normal breath sounds. No stridor. No wheezing, rhonchi or rales.  Abdominal:     General: Bowel sounds are normal. There is no distension.     Palpations: Abdomen is soft. There is no mass.     Tenderness: There is no abdominal tenderness.  Musculoskeletal:        General: No tenderness.     Cervical back: Full passive range of motion without pain, normal range of motion and neck supple.     Comments: Tenderness in musculature of CS, TS and LS  No focal neuro changes  Tender in trap muscles  Pain with movement     Lymphadenopathy:     Cervical: No cervical adenopathy.  Skin:    General: Skin is warm and dry.     Coloration: Skin is not pale.     Findings: No bruising or rash.   Neurological:     Mental Status: She is alert and oriented to person, place, and time.     Cranial Nerves: No cranial nerve deficit, dysarthria or facial asymmetry.     Sensory: Sensation is intact. No sensory deficit.     Motor: No weakness, tremor, atrophy, abnormal muscle tone, seizure activity or pronator drift.     Coordination: Coordination normal. Finger-Nose-Finger Test normal.     Gait: Gait normal.     Deep Tendon Reflexes: Reflexes are normal and symmetric. Reflexes normal.     Comments: No focal cerebellar signs   Psychiatric:  Behavior: Behavior normal.        Thought Content: Thought content normal.           Assessment & Plan:   Problem List Items Addressed This Visit       Other   History of fall   Tripped backward on stairs  Concussion  Body pain-no fractures  Continue to monitor  Reviewed hospital records, lab results and studies in detail    Follow up next wk for re check       Concussion - Primary   S/p posterior head injury from fall Reviewed hospital records, lab results and studies in detail   Reassuring CT head  Reassuring exam   Primary symptoms are headache (improved) , tinnitus and slowed concentration  Discussed taking time off for brain rest  Outlined this in detail, see AVS  Handout given re: head inj and concussion   Will rest from work/reading/ screens to speed recovery   Reviewed signs and symptoms of subdural hematoma to watch for closely   Call back and Er precautions noted in detail today   Follow up next wk for re check       Back pain   Pt has pain from neck to lumbar spine/ muscular along with stiffness since fall/injury  Reassuring CT of spine in ER Reviewed hospital records, lab results and studies in detail   Reassuring exam  Prescription sent for methocarbamol to use for muscle spasm to use with caution of sedation  Aleve prn with food   Will be resting to recover from concussion , after this consider PT  if needed Follow up next wk for re check        Relevant Medications   methocarbamol (ROBAXIN) 500 MG tablet

## 2023-12-05 NOTE — Patient Instructions (Addendum)
 Rest your brain  Avoid screens/reading harlon  Doze/ take breaks in a dark room   If any head symptoms worsen (headache/dizziness/ ringing in ears/ or any confusion)- call asap or go to ER   Some slow walking at home is ok to prevent body stiffness   Aleve is ok -with food Try the methocarbamol (muscle relaxer) for muscle pain with caution of sedation   Follow up next week for a re check   Stay hydrated   Follow up next week for a re check   I will work on your paperwork

## 2023-12-05 NOTE — Assessment & Plan Note (Signed)
 Tripped backward on stairs  Concussion  Body pain-no fractures  Continue to monitor  Reviewed hospital records, lab results and studies in detail    Follow up next wk for re check

## 2023-12-05 NOTE — Assessment & Plan Note (Signed)
 Pt has pain from neck to lumbar spine/ muscular along with stiffness since fall/injury  Reassuring CT of spine in ER Reviewed hospital records, lab results and studies in detail   Reassuring exam  Prescription sent for methocarbamol to use for muscle spasm to use with caution of sedation  Aleve prn with food   Will be resting to recover from concussion , after this consider PT if needed Follow up next wk for re check

## 2023-12-07 ENCOUNTER — Telehealth: Payer: Self-pay

## 2023-12-07 NOTE — Telephone Encounter (Signed)
 Received attending physician statement completed by provider  Faxed to [facility] at [phone]  Copy sent to scan  Copy left at front desk for patient to pick up as requested by patient.

## 2023-12-10 ENCOUNTER — Inpatient Hospital Stay: Admitting: Family Medicine

## 2023-12-12 ENCOUNTER — Ambulatory Visit: Admitting: Family Medicine

## 2023-12-12 ENCOUNTER — Encounter: Payer: Self-pay | Admitting: Family Medicine

## 2023-12-12 VITALS — BP 136/70 | HR 64 | Temp 98.7°F | Ht 66.0 in | Wt 170.4 lb

## 2023-12-12 DIAGNOSIS — M549 Dorsalgia, unspecified: Secondary | ICD-10-CM

## 2023-12-12 DIAGNOSIS — S060X0D Concussion without loss of consciousness, subsequent encounter: Secondary | ICD-10-CM

## 2023-12-12 DIAGNOSIS — Z9181 History of falling: Secondary | ICD-10-CM | POA: Diagnosis not present

## 2023-12-12 NOTE — Patient Instructions (Addendum)
 If any symptoms worsen please let us  know   Use white noise machine/app or relaxing music for the tinnitus   I put the referral in for PT  Please let us  know if you don't hear in 1-2 weeks to set that up (mychart message or call or letter)    At check out, make an appointment with Dr Watt for concussion   Continue frequent breaks in activity / brain rest when needed   Muscle relaxer as needed   Stay out of work another week and update us  in a week with how you are doing

## 2023-12-12 NOTE — Progress Notes (Signed)
 Subjective:    Patient ID: Leslie Gomez, female    DOB: 09/17/1965, 58 y.o.   MRN: [id number]  HPI  Wt Readings from Last 3 Encounters:  12/12/23 170 lb 6 oz (77.3 kg)  12/05/23 170 lb 6 oz (77.3 kg)  12/02/23 170 lb (77.1 kg)   27.50 kg/m  Vitals:   12/12/23 0920 12/12/23 0943  BP: (!) 142/80 136/70  Pulse: 64   Temp: 98.7 F (37.1 C)   SpO2: 100%     Pt presents for follow up of  Concussion  Back pain after fall/injury   Last visit reviewed hospital records and studies Reassuring CT head and spine CT  Took pt our of work for brain rest   Today  Some better  Ringing in ears is still problematic (that makes her anxious)  Headache is better - much less constant  No dizziness or nausea  Concentration is affected by the tinnitus but may be a bit better  A little sore in the back where she hit her head- but overall better   Relaxed and took a break from anything     Back still hurts  Robaxin wiped her out - used caution  Helped her sleep and relax  Rolling over/getting up is tough on her back  Lying flat on floor is most comfortable   Sitting-pain just above tail bone  Upper back hurts washing dishes  Can stand up straight now   No numbness or weakness      12/12/2023    9:37 AM 10/02/2023    9:12 AM 09/18/2022    8:25 AM 09/13/2021    8:19 AM 09/07/2020    4:44 PM  Depression screen PHQ 2/9  Decreased Interest 3 0 0 0 0  Down, Depressed, Hopeless 3 0 0 0 0  PHQ - 2 Score 6 0 0 0 0  Altered sleeping 3 0 0  0  Tired, decreased energy 3 0 0  0  Change in appetite 1 0 0  0  Feeling bad or failure about yourself  2 0 0  0  Trouble concentrating 3 0 0  0  Moving slowly or fidgety/restless 3 0 0  0  Suicidal thoughts 0 0 0  0  PHQ-9 Score 21 0 0  0  Difficult doing work/chores Extremely dIfficult Not difficult at all Not difficult at all  Not difficult at all      12/12/2023    9:37 AM 10/02/2023    9:13 AM 09/18/2022    8:25 AM  GAD 7 :  Generalized Anxiety Score  Nervous, Anxious, on Edge 3 0 0  Control/stop worrying 3 0 0  Worry too much - different things 3 0 0  Trouble relaxing 3 0 0  Restless 0 0 0  Easily annoyed or irritable 2 0 0  Afraid - awful might happen 2 0 0  Total GAD 7 Score 16 0 0  Anxiety Difficulty  Not difficult at all Not difficult at all    Pt thinks mood is affected by her concussion and fatigue  Does not think she has clinical mental health diagnoses    Patient Active Problem List   Diagnosis Date Noted   Concussion 12/05/2023   Back pain 12/05/2023   History of fall 12/05/2023   Thrombocytopenia 09/02/2019   Prediabetes 08/27/2017   Colon cancer screening 05/18/2015   Encounter for routine gynecological examination 01/17/2011   Routine general medical examination at a health care facility  01/09/2011   HYPERCHOLESTEROLEMIA 11/19/2006   Anemia 11/19/2006   Past Medical History:  Diagnosis Date   Anemia    NOS since childhood runs in family   Hyperlipidemia    Past Surgical History:  Procedure Laterality Date   ABDOMINOPLASTY  2018   CESAREAN SECTION     COSMETIC SURGERY     WISDOM TOOTH EXTRACTION     Social History   Tobacco Use   Smoking status: Never   Smokeless tobacco: Never  Vaping Use   Vaping status: Never Used  Substance Use Topics   Alcohol use: No   Drug use: No   Family History  Problem Relation Age of Onset   Hyperlipidemia Mother    Hypertension Mother    Anemia Mother    Stomach cancer Father    Cancer Father        stomach CA   Colon cancer Neg Hx    Breast cancer Neg Hx    Esophageal cancer Neg Hx    Rectal cancer Neg Hx    No Known Allergies Current Outpatient Medications on File Prior to Visit  Medication Sig Dispense Refill   atorvastatin  (LIPITOR) 10 MG tablet TAKE ONE-HALF TABLET BY MOUTH  DAILY 45 tablet 3   ferrous sulfate  325 (65 FE) MG tablet Take 1 tablet (325 mg total) by mouth daily. 1 tablet 0   methocarbamol (ROBAXIN) 500 MG  tablet Take 1 tablet (500 mg total) by mouth every 8 (eight) hours as needed for muscle spasms (muscle pain / back pain/neck pain). Caution of sedation 30 tablet 1   No current facility-administered medications on file prior to visit.    Review of Systems  Constitutional:  Positive for fatigue. Negative for activity change, appetite change, fever and unexpected weight change.  HENT:  Negative for congestion, ear pain, rhinorrhea, sinus pressure and sore throat.   Eyes:  Negative for pain, redness and visual disturbance.  Respiratory:  Negative for cough, shortness of breath and wheezing.   Cardiovascular:  Negative for chest pain and palpitations.  Gastrointestinal:  Negative for abdominal pain, blood in stool, constipation and diarrhea.  Endocrine: Negative for polydipsia and polyuria.  Genitourinary:  Negative for dysuria, frequency and urgency.  Musculoskeletal:  Positive for arthralgias, back pain and neck pain. Negative for joint swelling and myalgias.  Skin:  Negative for pallor and rash.  Allergic/Immunologic: Negative for environmental allergies.  Neurological:  Negative for dizziness, syncope, facial asymmetry, weakness, light-headedness and headaches.  Hematological:  Negative for adenopathy. Does not bruise/bleed easily.  Psychiatric/Behavioral:  Positive for sleep disturbance. Negative for decreased concentration, dysphoric mood and suicidal ideas. The patient is nervous/anxious.        No confusion but does have some slowed cognition        Objective:   Physical Exam Constitutional:      General: She is not in acute distress.    Appearance: Normal appearance. She is normal weight. She is not ill-appearing or diaphoretic.  HENT:     Head: Normocephalic and atraumatic.     Comments: Slight tenderness in occiput  No swelling or ecchymosis     Right Ear: Tympanic membrane, ear canal and external ear normal.     Left Ear: Tympanic membrane, ear canal and external ear normal.      Mouth/Throat:     Mouth: Mucous membranes are moist.  Eyes:     Conjunctiva/sclera: Conjunctivae normal.     Pupils: Pupils are equal, round, and reactive to light.  Cardiovascular:     Rate and Rhythm: Normal rate and regular rhythm.     Heart sounds: Normal heart sounds.  Pulmonary:     Effort: Pulmonary effort is normal. No respiratory distress.     Breath sounds: Normal breath sounds. No wheezing.  Musculoskeletal:     Comments: Moves slowly due to muscle stiffness in shoulder girdle and low back   Some mild tenderness over upper LS  Trapezius tenderness is improved Can flex CS fully/ more pain with extension   Neurological:     Mental Status: She is alert and oriented to person, place, and time.     Cranial Nerves: No cranial nerve deficit, dysarthria or facial asymmetry.     Sensory: Sensation is intact.     Motor: No weakness, tremor, abnormal muscle tone, seizure activity or pronator drift.     Coordination: Romberg sign negative.     Gait: Gait normal.     Comments: Finger-nose testing is very slow but otherwise normal  Normal romberg is normal  Pt has to work hard to keep balance with tandem gait (very slow) Also due to muscle stiffness  No focal strength loss    Psychiatric:        Attention and Perception: Attention normal.        Mood and Affect: Mood is anxious.        Behavior: Behavior normal.        Cognition and Memory: Cognition normal.     Comments: Mildly anxious Attentive  Speech is mildly slowed  Candidly discusses symptoms and stressors             Assessment & Plan:   Problem List Items Addressed This Visit       Other   History of fall   Relevant Orders   Ambulatory referral to Physical Therapy   Concussion - Primary   After a week of brain rest some improvement Less headache No dizziness /nausea or confusion  Tinnitus is biggest complaint and it causes slowed concentration   Encouraged use of white noise machine or app   Encouraged to take frequent breaks from reading/screens /limited work   Will keep out of work and monitor weekly  Will arrange appointment with sport med/Dr Copland as well   Update if not starting to improve in a week or if worsening  Call back and Er precautions noted in detail today        Back pain   Some slow improvement  Still very stiff from neck to lumbar spine -bilateral / tight muscles Improves a bit once she gets going No neuro changes   Referring for PT eval and treat   Call back and Er precautions noted in detail today        Relevant Orders   Ambulatory referral to Physical Therapy

## 2023-12-12 NOTE — Assessment & Plan Note (Signed)
 Some slow improvement  Still very stiff from neck to lumbar spine -bilateral / tight muscles Improves a bit once she gets going No neuro changes   Referring for PT eval and treat   Call back and Er precautions noted in detail today

## 2023-12-12 NOTE — Assessment & Plan Note (Addendum)
 After a week of brain rest some improvement Less headache No dizziness /nausea or confusion  Tinnitus is biggest complaint and it causes slowed concentration   Encouraged use of white noise machine or app  Encouraged to take frequent breaks from reading/screens /limited work   Will keep out of work and monitor weekly  Will arrange appointment with sport med/Dr Copland as well   Update if not starting to improve in a week or if worsening  Call back and Er precautions noted in detail today    I personally spent a total of 31 minutes in the care of the patient today including preparing to see the patient, getting/reviewing separately obtained history, performing a medically appropriate exam/evaluation, counseling and educating, placing orders, referring and communicating with other health care professionals, and documenting clinical information in the EHR.

## 2023-12-16 NOTE — Progress Notes (Unsigned)
     Leslie Verge T. Briyanna [name], MD, CAQ Sports Medicine [facility] at [facility] [address] [place], [address]  Phone: [phone]  FAX: [phone]  Leslie Gomez - 58 y.o. female  MRN [id number]  Date of Birth: [birth year]  Date: 12/19/2023  PCP: Randeen Laine LABOR, MD  Referral: Randeen Laine LABOR, MD  No chief complaint on file.  Subjective:   Leslie Gomez is a 58 y.o. very pleasant female patient with There is no height or weight on file to calculate BMI. who presents with the following:  Discussed the use of AI scribe software for clinical note transcription with the patient, who gave verbal consent to proceed.  Patient presents for concussion management.  She has seen her primary care doctor, Dr. Randeen, twice now for concussion.  She was seen in the emergency room on December 02, 2023 with a closed head injury.  She slipped and fell down the stairs and hit her head on the back of the stairs.  CTs of the head, C-spine, thoracic spine, and lumbar spine were all done. CT head showed no intracranial hemorrhage.  She was taken out of work for provide her primary care doctor who wanted the patient to follow-up from me for additional long-term concussion management. -She also had some ongoing back pain. History of Present Illness     Review of Systems is noted in the HPI, as appropriate  Objective:   LMP 04/11/2016 (Approximate)   GEN: No acute distress; alert,appropriate. PULM: Breathing comfortably in no respiratory distress PSYCH: Normally interactive.   Laboratory and Imaging Data:  Assessment and Plan:   No diagnosis found. Assessment & Plan   Medication Management during today's office visit: No orders of the defined types were placed in this encounter.  There are no discontinued medications.  Orders placed today for conditions managed today: No orders of the defined types were placed in this encounter.   Disposition:  No follow-ups on file.  Dr[employer] One speech-to-text software was used for transcription in this dictation.  Possible transcriptional errors can occur using Animal nutritionist.   Signed,  Jacques DASEN. Kanav Kazmierczak, MD   Outpatient Encounter Medications as of 12/19/2023  Medication Sig   atorvastatin  (LIPITOR) 10 MG tablet TAKE ONE-HALF TABLET BY MOUTH  DAILY   ferrous sulfate  325 (65 FE) MG tablet Take 1 tablet (325 mg total) by mouth daily.   methocarbamol (ROBAXIN) 500 MG tablet Take 1 tablet (500 mg total) by mouth every 8 (eight) hours as needed for muscle spasms (muscle pain / back pain/neck pain). Caution of sedation   No facility-administered encounter medications on file as of 12/19/2023.

## 2023-12-19 ENCOUNTER — Ambulatory Visit: Admitting: Family Medicine

## 2023-12-19 ENCOUNTER — Encounter: Payer: Self-pay | Admitting: Family Medicine

## 2023-12-19 VITALS — BP 140/90 | HR 90 | Temp 98.4°F | Ht 66.0 in | Wt 170.5 lb

## 2023-12-19 DIAGNOSIS — G4700 Insomnia, unspecified: Secondary | ICD-10-CM

## 2023-12-19 DIAGNOSIS — S060X0D Concussion without loss of consciousness, subsequent encounter: Secondary | ICD-10-CM

## 2023-12-19 DIAGNOSIS — S060X0A Concussion without loss of consciousness, initial encounter: Secondary | ICD-10-CM

## 2023-12-19 DIAGNOSIS — M542 Cervicalgia: Secondary | ICD-10-CM | POA: Diagnosis not present

## 2023-12-19 DIAGNOSIS — F419 Anxiety disorder, unspecified: Secondary | ICD-10-CM

## 2023-12-19 MED ORDER — TRAZODONE HCL 50 MG PO TABS
50.0000 mg | ORAL_TABLET | Freq: Every evening | ORAL | 1 refills | Status: AC | PRN
Start: 2023-12-19 — End: ?

## 2023-12-26 ENCOUNTER — Encounter: Payer: Self-pay | Admitting: Family Medicine

## 2023-12-27 NOTE — Therapy (Deleted)
 OUTPATIENT PHYSICAL THERAPY THORACOLUMBAR EVALUATION   Patient Name: Leslie Gomez MRN: [id number] DOB:Nov 08, 1965, 58 y.o., female Today's Date: 12/27/2023  END OF SESSION:   Past Medical History:  Diagnosis Date   Anemia    NOS since childhood runs in family   Hyperlipidemia    Past Surgical History:  Procedure Laterality Date   ABDOMINOPLASTY  2018   CESAREAN SECTION     COSMETIC SURGERY     WISDOM TOOTH EXTRACTION     Patient Active Problem List   Diagnosis Date Noted   Concussion 12/05/2023   Back pain 12/05/2023   History of fall 12/05/2023   Thrombocytopenia 09/02/2019   Prediabetes 08/27/2017   Colon cancer screening 05/18/2015   Encounter for routine gynecological examination 01/17/2011   Routine general medical examination at a health care facility 01/09/2011   HYPERCHOLESTEROLEMIA 11/19/2006   Anemia 11/19/2006    PCP: Randeen Laine LABOR, MD  REFERRING PROVIDER: Randeen Laine LABOR, MD  REFERRING DIAG: History of falls, acute back pain unspecified back location, unspecified back pain laterality  Rationale for Evaluation and Treatment: {HABREHAB:27488}  THERAPY DIAG:  No diagnosis found.  ONSET DATE: 12/01/2023  SUBJECTIVE:                                                                                                                                                                                           SUBJECTIVE STATEMENT: ***  PERTINENT HISTORY:  ***Patient is a 58 y.o. female who presents to outpatient physical therapy with a referral for medical diagnosis History of falls, acute back pain unspecified back location, unspecified back pain laterality. This patient's chief complaints consist of ***, leading to the following functional deficits: ***. Relevant past medical history and comorbidities include the following: she has HYPERCHOLESTEROLEMIA; Anemia; Routine general medical examination at a health care facility; Encounter for routine  gynecological examination; Colon cancer screening; Prediabetes; Thrombocytopenia; Concussion; Back pain; and History of fall on their problem list. she  has a past medical history of Anemia and Hyperlipidemia. she  has a past surgical history that includes Cesarean section; Wisdom tooth extraction; Abdominoplasty (2018); and Cosmetic surgery. Patient denies hx of {redflags:27294}  Exercise history: ***    PAIN:  Are you having pain? {OPRCPAIN:27236}  PRECAUTIONS: {Therapy precautions:24002}  RED FLAGS: {PT Red Flags:29287}   WEIGHT BEARING RESTRICTIONS: {Yes ***/No:24003}  FALLS:  Has patient fallen in last 6 months? Yes. Number of falls ***  LIVING ENVIRONMENT: Lives with: {OPRC lives with:25569::lives with their family} Lives in: {Lives in:25570} Stairs: {opstairs:27293} Has following equipment at home: {Assistive devices:23999}  OCCUPATION: ***  PLOF: {PLOF:24004}  PATIENT GOALS: ***  NEXT  MD VISIT: ***  OBJECTIVE:  Note: Objective measures were completed at Evaluation unless otherwise noted.  DIAGNOSTIC FINDINGS:   CT HEAD WITHOUT CONTRAST 12/02/2023 11:06:35 AM   TECHNIQUE: CT of the head was performed without the administration of intravenous contrast. Automated exposure control, iterative reconstruction, and/or weight based adjustment of the mA/kV was utilized to reduce the radiation dose to as low as reasonably achievable.   COMPARISON: None available.   CLINICAL HISTORY: Head trauma, moderate-severe, following a mechanical fall yesterday morning. Patient complains of right arm tingling, back pain, and headache. The headache started today.   FINDINGS:   BRAIN AND VENTRICLES: No acute hemorrhage. No evidence of acute infarct. No hydrocephalus. No extra-axial collection. No mass effect or midline shift.   ORBITS: No acute abnormality.   SINUSES: No acute abnormality.   SOFT TISSUES AND SKULL: No acute soft tissue abnormality. No skull  fracture.   IMPRESSION: 1. No acute intracranial abnormality.  CT CERVICAL SPINE WITHOUT CONTRAST 12/02/2023 11:06:35 AM   TECHNIQUE: CT of the cervical spine was performed without the administration of intravenous contrast. Multiplanar reformatted images are provided for review. Automated exposure control, iterative reconstruction, and/or weight based adjustment of the mA/kV was utilized to reduce the radiation dose to as low as reasonably achievable.   COMPARISON: None available.   CLINICAL HISTORY: Neck trauma, dangerous injury mechanism (Age 50-64y). Pt c/o right arm tingling, back pain, and headache falling a mechanical fall yesterday morning. Pt states back pain occurred immediately following the fall, however headache did not start until today.   FINDINGS:   CERVICAL SPINE:   BONES AND ALIGNMENT: Slight reversal of the normal cervical lordosis is present. No acute fracture or traumatic malalignment.   DEGENERATIVE CHANGES: Endplate change and uncovertebral spurring is greatest at C5-C6. Mild left foraminal narrowing is present at C5-C6.   SOFT TISSUES: No prevertebral soft tissue swelling.   IMPRESSION: 1. No acute abnormality of the cervical spine related to the reported trauma. 2. Slight reversal of the normal cervical lordosis.   CT THORACIC SPINE WITHOUT CONTRAST 12/02/2023 11:06:35 AM   TECHNIQUE: CT of the thoracic spine was performed without the administration of intravenous contrast. Multiplanar reformatted images are provided for review. Automated exposure control, iterative reconstruction, and/or weight based adjustment of the mA/kV was utilized to reduce the radiation dose to as low as reasonably achievable.   COMPARISON: None available.   CLINICAL HISTORY: Back trauma, no prior imaging (Age >= 16y). Patient complains of right arm tingling, back pain, and headache following a mechanical fall yesterday morning. Patient states back pain  occurred immediately following the fall, however headache did not start until today.   FINDINGS:   BONES AND ALIGNMENT: Normal vertebral body heights. No acute fracture or suspicious bone lesion. Mild rightward curvature centered at T7-T8. No significant listhesis is present.   DEGENERATIVE CHANGES: No significant degenerative changes.   SOFT TISSUES: No acute abnormality.   IMPRESSION: 1. No acute thoracic spine abnormality related to back trauma.  CT OF THE LUMBAR SPINE WITHOUT CONTRAST 12/02/2023 11:06:35 AM   TECHNIQUE: CT of the lumbar spine was performed without the administration of intravenous contrast. Multiplanar reformatted images are provided for review. Automated exposure control, iterative reconstruction, and/or weight based adjustment of the mA/kV was utilized to reduce the radiation dose to as low as reasonably achievable.   COMPARISON: None available.   CLINICAL HISTORY: Back trauma, no prior imaging. Patient complains of right arm tingling, back pain, and headache following a mechanical fall  yesterday morning. Back pain occurred immediately, while headache started today.   FINDINGS:   BONES AND ALIGNMENT: Normal vertebral body heights. No acute fracture or suspicious bone lesion. Normal alignment.   DEGENERATIVE CHANGES: Mild facet degenerative change is present at L4-L5 and L5-S1 without significant disc protrusion or stenosis at either level.   SOFT TISSUES: No acute abnormality.   IMPRESSION: 1. No acute findings.  PATIENT SURVEYS:  {rehab surveys:24030}  COGNITION: Overall cognitive status: {cognition:24006}     SENSATION: {sensation:27233}  MUSCLE LENGTH: Hamstrings: Right *** deg; Left *** deg Debby test: Right *** deg; Left *** deg  POSTURE: {posture:25561}  PALPATION: ***  LUMBAR ROM:   AROM eval  Flexion   Extension   Right lateral flexion   Left lateral flexion   Right rotation   Left rotation    (Blank rows  = not tested)  LOWER EXTREMITY ROM:     {AROM/PROM:27142}  Right eval Left eval  Hip flexion    Hip extension    Hip abduction    Hip adduction    Hip internal rotation    Hip external rotation    Knee flexion    Knee extension    Ankle dorsiflexion    Ankle plantarflexion    Ankle inversion    Ankle eversion     (Blank rows = not tested)  LOWER EXTREMITY MMT:    MMT Right eval Left eval  Hip flexion    Hip extension    Hip abduction    Hip adduction    Hip internal rotation    Hip external rotation    Knee flexion    Knee extension    Ankle dorsiflexion    Ankle plantarflexion    Ankle inversion    Ankle eversion     (Blank rows = not tested)  LUMBAR SPECIAL TESTS:  {lumbar special test:25242}  FUNCTIONAL TESTS:  {Functional tests:24029}  GAIT: Distance walked: *** Assistive device utilized: {Assistive devices:23999} Level of assistance: {Levels of assistance:24026} Comments: ***  TREATMENT : ***                                                                                                                                 PATIENT EDUCATION:  Education details: Exercise purpose/form. Self management techniques. Education on diagnosis, prognosis, POC, anatomy and physiology of current condition. Education on HEP including handout. Person educated: Patient Education method: Explanation, Demonstration, Tactile cues, Verbal cues, and Handouts Education comprehension: {Education Comprehension:25206}  HOME EXERCISE PROGRAM: ***  ASSESSMENT:  CLINICAL IMPRESSION: Patient is a 58 y.o. female who was seen today for physical therapy evaluation and treatment for history of falls, acute back pain unspecified back location, unspecified back pain laterality.   OBJECTIVE IMPAIRMENTS: {opptimpairments:25111}.   ACTIVITY LIMITATIONS: {activitylimitations:27494}  PARTICIPATION LIMITATIONS: {participationrestrictions:25113}  PERSONAL FACTORS: {Personal  factors:25162} are also affecting patient's functional outcome.   REHAB POTENTIAL: Good  CLINICAL DECISION MAKING: {clinical decision making:25114}  EVALUATION  COMPLEXITY: {Evaluation complexity:25115}  GOALS: Goals reviewed with patient? No  SHORT TERM GOALS: Target date: 01/10/2024  Patient will be independent with initial home exercise program for self-management of symptoms. Baseline: Initial HEP provided at IE (12/27/23); Goal status: INITIAL  LONG TERM GOALS: Target date: 03/20/2024  Patient will be independent with a long-term home exercise program for self-management of symptoms.  Baseline: Initial HEP provided at IE (12/27/23); Goal status: INITIAL  2.  Patient will demonstrate improved {SarasLTGPRO:32233} to demonstrate improvement in overall condition and self-reported functional ability.  Baseline: {Sarasgoalbaseline:32234} (12/27/23); Goal status: INITIAL  3.  *** Baseline: {Sarasgoalbaseline:32234} (12/27/23); Goal status: INITIAL  4.  *** Baseline: {Sarasgoalbaseline:32234} (12/27/23); Goal status: INITIAL  5.  Patient will demonstrate improvement in Patient Specific Functional Scale (PSFS) of equal or greater than 8/10 points to reflect clinically significant improvement in patient's most valued functional activities. Baseline: {Sarasgoalbaseline:32234} (12/27/23); Goal status: INITIAL  6.  Patient will report NPRS equal or less than 3/10 during functional activities during the last 2 weeks to improve their abilitly to complete community, work and/or recreational activities with less limitation. Baseline: ***/10 (12/27/23); Goal status: INITIAL   PLAN:  PT FREQUENCY: {rehab frequency:25116}  PT DURATION: {rehab duration:25117}  PLANNED INTERVENTIONS: {rehab planned interventions:25118::97110-Therapeutic exercises,97530- Therapeutic [phone]- Neuromuscular re-education,97535- Self Rjmz,02859- Manual therapy,Patient/Family  education}.  PLAN FOR NEXT SESSION: ***   Vernell Rise, Student-SLP 12/27/2023, 3:08 PM

## 2023-12-31 ENCOUNTER — Ambulatory Visit: Admitting: Physical Therapy

## 2024-01-01 NOTE — Therapy (Signed)
 OUTPATIENT PHYSICAL THERAPY THORACOLUMBAR EVALUATION   Patient Name: Leslie Gomez MRN: [id number] DOB:1965-09-24, 58 y.o., female Today's Date: 01/02/2024  END OF SESSION:  PT End of Session - 01/02/24 1101     Visit Number 1    Number of Visits 17    Date for Recertification  02/27/24    PT Start Time 1113    PT Stop Time 1154    PT Time Calculation (min) 41 min    Activity Tolerance Patient tolerated treatment well    Behavior During Therapy [facility] for tasks assessed/performed          Past Medical History:  Diagnosis Date   Anemia    NOS since childhood runs in family   Hyperlipidemia    Past Surgical History:  Procedure Laterality Date   ABDOMINOPLASTY  2018   CESAREAN SECTION     COSMETIC SURGERY     WISDOM TOOTH EXTRACTION     Patient Active Problem List   Diagnosis Date Noted   Concussion 12/05/2023   Back pain 12/05/2023   History of fall 12/05/2023   Thrombocytopenia 09/02/2019   Prediabetes 08/27/2017   Colon cancer screening 05/18/2015   Encounter for routine gynecological examination 01/17/2011   Routine general medical examination at a health care facility 01/09/2011   HYPERCHOLESTEROLEMIA 11/19/2006   Anemia 11/19/2006    PCP: Randeen Laine LABOR, MD  REFERRING PROVIDER: Randeen Laine LABOR, MD  REFERRING DIAG:  [phone] (ICD-10-CM) - History of fall M54.9 (ICD-10-CM) - Acute back pain, unspecified back location, unspecified back pain laterality  Rationale for Evaluation and Treatment: Rehabilitation  THERAPY DIAG:  Muscle weakness (generalized)  Other low back pain  ONSET DATE: 12/01/23 following fall down stairs   SUBJECTIVE:                                                                                                                                                                                           SUBJECTIVE STATEMENT: She reports she was treated for concussion first before coming to PT for back pain. Patient reports her pain  in her back is like a C (most pain in L lower back). She finds relief by laying flat in her living room floor. She has trouble sitting for >15 minutes and standing in one place >10 minutes (doing dishes). Unable to take muscle relaxer due to making her increasingly drowsy.   PERTINENT HISTORY:  On 12/02/23 was seen in the ED, patient had a slip and fall down stairs on 10/11. She complained of headache, back pain, and neck pain. She reported hitting her head and back on the stairs. Imaging was negative  for acute fracture. Treated as concussion without loss of consciousness. Continues with ongoing headache, ringing in ears, and back pain.   PAIN:  Are you having pain? Yes: NPRS scale: 6/10 current; 9/10 worst Pain location: L lower back Pain description: aching  Aggravating factors: sitting, standing too long, first thing in the morning when she is getting up Relieving factors: laying flat in living floor, hot shower, taking Aleve, prescribed muscle relaxer but this makes her drowsy.  PRECAUTIONS: None  RED FLAGS: None   WEIGHT BEARING RESTRICTIONS: No  FALLS:  Has patient fallen in last 6 months? Yes. 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  DIAGNOSTIC FINDINGS:  All imaging negative from ED visit on 12/02/23  PATIENT SURVEYS:  Modified Oswestry:  MODIFIED OSWESTRY DISABILITY SCALE  Date: 01/02/24 Score  Pain intensity 1 = The pain is bad, but I can manage without having to take pain medication  2. Personal care (washing, dressing,  etc.) 0 =  I can take care of myself normally without causing increased pain.  3. Lifting 4 = I can lift only very light weights  4. Walking 2 =  Pain prevents me from walking more than  mile.  5. Sitting 4 =  Pain prevents me from sitting more than 10 minutes.  6. Standing 1 =  I can stand as long as I want but, it increases my pain.  7. Sleeping 2 =  Even when I take pain medication, I sleep less than 6 hours  8. Social Life 4 =  Pain has restricted my social life to my home  9. Traveling 4 = My pain restricts my travel to short necessary journeys under 1/2 hour.  10. Employment/ Homemaking 4 = Pain prevents me from doing even light duties.  Total 26/50 = 52%   Interpretation of scores: Score Category Description  0-20% Minimal Disability The patient can cope with most living activities. Usually no treatment is indicated apart from advice on lifting, sitting and exercise  21-40% Moderate Disability The patient experiences more pain and difficulty with sitting, lifting and standing. Travel and social life are more difficult and they may be disabled from work. Personal care, sexual activity and sleeping are not grossly affected, and the patient can usually be managed by conservative means  41-60% Severe Disability Pain remains the main problem in this group, but activities of daily living are affected. These patients require a detailed investigation  61-80% Crippled Back pain impinges on all aspects of the patient's life. Positive intervention is required  81-100% Bed-bound These patients are either bed-bound or exaggerating their symptoms  Bluford FORBES Zoe DELENA Karon DELENA, et al. Surgery versus conservative management of stable thoracolumbar fracture: the PRESTO feasibility RCT. [place] ([place]): [employer]; 2021 Nov. [facility] Technology Assessment, No. 25.62.) Appendix 3, Oswestry Disability Index category descriptors. Available from:  Findjewelers.cz  Minimally Clinically Important Difference (MCID) = 12.8%  COGNITION: Overall cognitive status: Within functional limits for tasks assessed     SENSATION: WFL  MUSCLE LENGTH: Hamstrings length limited bilaterally  POSTURE: No Significant postural limitations  PALPATION: Significant TTP to L>R paraspinals (lumbar and thoracic) Unable to assess passive accessory motions of lumbar/thoracic spine   LUMBAR ROM:   AROM eval  Flexion 50%*  Extension 75%*  Right lateral flexion Fingertips to 3/4 of thigh   Left lateral flexion Fingertips to 3/4 of thigh*  Right rotation 25%*  Left rotation 25%*   (Blank rows = not tested)  LOWER EXTREMITY MMT:    MMT Right eval Left eval  Hip flexion 4 4-  Hip extension    Hip abduction 4 4  Hip adduction 4+ 4+  Hip internal rotation    Hip external rotation    Knee flexion 4+ 4+  Knee extension 4+ 4  Ankle dorsiflexion 4+ 4+  Ankle plantarflexion    Ankle inversion    Ankle eversion     (Blank rows = not tested)  LUMBAR SPECIAL TESTS:  None tested   FUNCTIONAL TESTS:  30 seconds chair stand test: to be completed at visit #2 6 minute walk test: to be completed visit #2  GAIT: Distance walked: 81' Assistive device utilized: None Level of assistance: Complete Independence Comments: no significant gait deviations   TREATMENT DATE: 01/02/24                                                                                                                               Education on log roll technique for bed mobility with good follow through demonstration.   PATIENT EDUCATION:  Education details: HEP, POC, goals Person educated: Patient Education method: Explanation, Demonstration, and Handouts Education comprehension: verbalized understanding and returned demonstration  HOME EXERCISE PROGRAM: Access Code: [email or web address] URL: [email or web address] Date:  01/02/2024 Prepared by: Maryanne Finder  Exercises - Supine Lower Trunk Rotation  - 1-3 x daily - 7 x weekly - 10 reps - 5-10 second hold - Sidelying Thoracic Rotation with Open Book  - 1-3 x daily - 7 x weekly - 10 reps - 5-10 second hold - Seated Flexion Stretch with Swiss Ball  - 1-3 x daily - 7 x weekly - 10 reps - 5-10 second hold - Seated Hamstring Stretch  - 1-3 x daily - 7 x weekly - 3-5 reps - 30-60 seconds hold  ASSESSMENT:  CLINICAL IMPRESSION: Patient is a 58 y.o. female who was seen today for physical therapy evaluation and treatment for low back pain following fall down stairs. Patient demonstrates significant tenderness to palpation to L>R thoracic/lumbar paraspinals with associated tightness/tension. Patient with limited lumbar ROM 2/2 pain and tightness. HEP initiated with focus on stretching of lower back, thoracic, and hamstrings. Patient will benefit from skilled PT interventions to address listed impairments to improve quality of life and reduce pain.   OBJECTIVE IMPAIRMENTS: decreased activity tolerance, decreased endurance, decreased mobility, difficulty walking, decreased strength, hypomobility, increased muscle spasms, impaired flexibility, and pain.   ACTIVITY LIMITATIONS: sitting, standing, sleeping, and locomotion level  PARTICIPATION LIMITATIONS: cleaning, laundry, community  activity, occupation, and yard work  PERSONAL FACTORS: Age, Past/current experiences, and Time since onset of injury/illness/exacerbation are also affecting patient's functional outcome.   REHAB POTENTIAL: Good  CLINICAL DECISION MAKING: Stable/uncomplicated  EVALUATION COMPLEXITY: Low   GOALS: Goals reviewed with patient? Yes  SHORT TERM GOALS: Target date: 01/30/2024  Patient will be independent in HEP to improve strength/mobility for better functional independence with ADLs.  Baseline: 01/02/24: HEP initiated Goal status: INITIAL   LONG TERM GOALS: Target date:  02/27/2024  Patient will reduce modified Oswestry score to <20 as to demonstrate minimal disability with ADLs including improved sleeping tolerance, walking/sitting tolerance etc for better mobility with ADLs. Baseline: 01/02/24: 26/50 = 52%  Goal status: INITIAL  2.  Patient will report a worst pain of <3/10 on NRPS in low back  to improve tolerance with ADLs and reduced symptoms with activities.   Baseline: 01/02/24: 9/10 pain Goal status: INITIAL  3.  Patient will be able to perform household work/ chores without increase in symptoms to >2/10 pain.  Baseline: 01/02/24: unable to stand and complete dishes for >10 minutes before laying down Goal status: INITIAL  4.  Patient will increase lumbar ROM to WNL to demonstrate improve muscular tightness and tolerance to movement. Baseline: 01/02/24: see above, limited due to pain and muscular tightness Goal status: INITIAL  5.  Patient will increase 30 second chair stand reps by 4 reps to demonstrate improved functional BLE strength and tolerance to activity.  Baseline: 01/02/24: to be completed visit #2  Goal status: INITIAL  6.  Patient will improve by 5m (164') in order to demonstrate clinically significant improvement in cardiopulmonary endurance and community ambulation  Baseline: 01/02/24: to be completed visit #2  Goal status: INITIAL  PLAN:  PT FREQUENCY: 1-2x/week  PT DURATION: 8 weeks  PLANNED INTERVENTIONS: 97164- PT Re-evaluation, 97750- Physical Performance Testing, 97110-Therapeutic exercises, 97530- Therapeutic activity, 97112- Neuromuscular re-education, 97535- Self Care, 02859- Manual therapy, G0283- Electrical stimulation (unattended), [id number]- Electrical stimulation (manual), 20560 (1-2 muscles), 20561 (3+ muscles)- Dry Needling, Patient/Family education, Joint mobilization, Joint manipulation, Spinal manipulation, Spinal mobilization, Cryotherapy, and Moist heat.  PLAN FOR NEXT SESSION: 30 second chair stand, ,  manual STM to lumbar/thoracic paraspinals if tolerated, stretching    Maryanne Finder, PT, DPT Physical Therapist - [facility] 01/02/2024, 12:15 PM

## 2024-01-02 ENCOUNTER — Ambulatory Visit: Admitting: Physical Therapy

## 2024-01-02 ENCOUNTER — Ambulatory Visit: Attending: Family Medicine

## 2024-01-02 ENCOUNTER — Encounter: Payer: Self-pay | Admitting: Physical Therapy

## 2024-01-02 DIAGNOSIS — M549 Dorsalgia, unspecified: Secondary | ICD-10-CM | POA: Diagnosis not present

## 2024-01-02 DIAGNOSIS — M5459 Other low back pain: Secondary | ICD-10-CM | POA: Insufficient documentation

## 2024-01-02 DIAGNOSIS — Z9181 History of falling: Secondary | ICD-10-CM | POA: Insufficient documentation

## 2024-01-02 DIAGNOSIS — M6281 Muscle weakness (generalized): Secondary | ICD-10-CM | POA: Diagnosis present

## 2024-01-07 ENCOUNTER — Ambulatory Visit

## 2024-01-07 ENCOUNTER — Encounter: Payer: Self-pay | Admitting: Physical Therapy

## 2024-01-07 DIAGNOSIS — M5459 Other low back pain: Secondary | ICD-10-CM

## 2024-01-07 DIAGNOSIS — M6281 Muscle weakness (generalized): Secondary | ICD-10-CM

## 2024-01-07 NOTE — Therapy (Signed)
 OUTPATIENT PHYSICAL THERAPY THORACOLUMBAR TREATMENT   Patient Name: Leslie Gomez MRN: [id number] DOB:Aug 02, 1965, 58 y.o., female Today's Date: 01/07/2024  END OF SESSION:  PT End of Session - 01/07/24 1119     Visit Number 2    Number of Visits 17    Date for Recertification  02/27/24    PT Start Time 1120    PT Stop Time 1200    PT Time Calculation (min) 40 min    Activity Tolerance Patient tolerated treatment well    Behavior During Therapy [facility] for tasks assessed/performed          Past Medical History:  Diagnosis Date   Anemia    NOS since childhood runs in family   Hyperlipidemia    Past Surgical History:  Procedure Laterality Date   ABDOMINOPLASTY  2018   CESAREAN SECTION     COSMETIC SURGERY     WISDOM TOOTH EXTRACTION     Patient Active Problem List   Diagnosis Date Noted   Concussion 12/05/2023   Back pain 12/05/2023   History of fall 12/05/2023   Thrombocytopenia 09/02/2019   Prediabetes 08/27/2017   Colon cancer screening 05/18/2015   Encounter for routine gynecological examination 01/17/2011   Routine general medical examination at a health care facility 01/09/2011   HYPERCHOLESTEROLEMIA 11/19/2006   Anemia 11/19/2006    PCP: Randeen Laine LABOR, MD  REFERRING PROVIDER: Randeen Laine LABOR, MD  REFERRING DIAG:  [phone] (ICD-10-CM) - History of fall M54.9 (ICD-10-CM) - Acute back pain, unspecified back location, unspecified back pain laterality  Rationale for Evaluation and Treatment: Rehabilitation  THERAPY DIAG:  Muscle weakness (generalized)  Other low back pain  ONSET DATE: 12/01/23 following fall down stairs   SUBJECTIVE:                                                                                                                                                                                           SUBJECTIVE STATEMENT: Pt reports HEP compliance. Reports pain vastly improved with HEP. Currently 5/10 NPS.  PERTINENT HISTORY:   On 12/02/23 was seen in the ED, patient had a slip and fall down stairs on 10/11. She complained of headache, back pain, and neck pain. She reported hitting her head and back on the stairs. Imaging was negative for acute fracture. Treated as concussion without loss of consciousness. Continues with ongoing headache, ringing in ears, and back pain.   PAIN:  Are you having pain? Yes: NPRS scale: 6/10 current; 9/10 worst Pain location: L lower back Pain description: aching  Aggravating factors: sitting, standing too long, first thing in the morning when she is  getting up Relieving factors: laying flat in living floor, hot shower, taking Aleve, prescribed muscle relaxer but this makes her drowsy.  PRECAUTIONS: None  RED FLAGS: None   WEIGHT BEARING RESTRICTIONS: No  FALLS:  Has patient fallen in last 6 months? Yes. 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  DIAGNOSTIC FINDINGS:  All imaging negative from ED visit on 12/02/23  PATIENT SURVEYS:  Modified Oswestry:  MODIFIED OSWESTRY DISABILITY SCALE  Date: 01/02/24 Score  Pain intensity 1 = The pain is bad, but I can manage without having to take pain medication  2. Personal care (washing, dressing, etc.) 0 =  I can take care of myself normally without causing increased pain.  3. Lifting 4 = I can lift only very light weights  4. Walking 2 =  Pain prevents me from walking more than  mile.  5. Sitting 4 =  Pain prevents me from sitting more than 10 minutes.  6. Standing 1 =  I can stand as  long as I want but, it increases my pain.  7. Sleeping 2 =  Even when I take pain medication, I sleep less than 6 hours  8. Social Life 4 =  Pain has restricted my social life to my home  9. Traveling 4 = My pain restricts my travel to short necessary journeys under 1/2 hour.  10. Employment/ Homemaking 4 = Pain prevents me from doing even light duties.  Total 26/50 = 52%   Interpretation of scores: Score Category Description  0-20% Minimal Disability The patient can cope with most living activities. Usually no treatment is indicated apart from advice on lifting, sitting and exercise  21-40% Moderate Disability The patient experiences more pain and difficulty with sitting, lifting and standing. Travel and social life are more difficult and they may be disabled from work. Personal care, sexual activity and sleeping are not grossly affected, and the patient can usually be managed by conservative means  41-60% Severe Disability Pain remains the main problem in this group, but activities of daily living are affected. These patients require a detailed investigation  61-80% Crippled Back pain impinges on all aspects of the patient's life. Positive intervention is required  81-100% Bed-bound These patients are either bed-bound or exaggerating their symptoms  Bluford FORBES Zoe DELENA Karon DELENA, et al. Surgery versus conservative management of stable thoracolumbar fracture: the PRESTO feasibility RCT. [place] ([place]): [employer]; 2021 Nov. [facility] Technology Assessment, No. 25.62.) Appendix 3, Oswestry Disability Index category descriptors. Available from: Findjewelers.cz  Minimally Clinically Important Difference (MCID) = 12.8%  COGNITION: Overall cognitive status: Within functional limits for tasks assessed     SENSATION: WFL  MUSCLE LENGTH: Hamstrings length limited bilaterally  POSTURE: No Significant postural limitations  PALPATION: Significant TTP to  L>R paraspinals (lumbar and thoracic) Unable to assess passive accessory motions of lumbar/thoracic spine   LUMBAR ROM:   AROM eval  Flexion 50%*  Extension 75%*  Right lateral flexion Fingertips to 3/4 of thigh   Left lateral flexion Fingertips to 3/4 of thigh*  Right rotation 25%*  Left rotation 25%*   (Blank rows = not tested)  LOWER EXTREMITY MMT:    MMT Right eval Left eval  Hip flexion 4 4-  Hip extension    Hip abduction 4 4  Hip adduction 4+ 4+  Hip internal rotation    Hip external rotation    Knee flexion 4+ 4+  Knee extension 4+ 4  Ankle dorsiflexion 4+ 4+  Ankle plantarflexion    Ankle inversion    Ankle eversion     (Blank rows = not tested)  LUMBAR SPECIAL TESTS:  None tested   FUNCTIONAL TESTS:  30 seconds chair stand test: to be completed at visit #2; 01/07/24: 3 reps  6 minute walk test: to be completed visit #2; 01/07/24: 1,052'  GAIT: Distance walked: 50' Assistive device utilized: None Level of assistance: Complete Independence Comments: no significant gait deviations   TREATMENT DATE: 01/07/24                                                                                                                               Physical Performance:   30 sec STS: 3 reps, BUE support. 15 is for age matched norms.   6 MWT: 1,052'   There.Ex:  Reviewed HEP as listed below   Access Code: [email or web address] URL: [email or web address] Date: 01/02/2024 Prepared by: Maryanne Finder  Exercises - Supine Lower Trunk Rotation  - 1-3 x daily - 7 x weekly - 10 reps - 5-10 second hold - Sidelying Thoracic Rotation with Open Book  - 1-3 x daily - 7 x weekly - 10 reps - 5-10 second hold - Seated Flexion Stretch with Swiss Ball  - 1-3 x daily - 7 x weekly - 10 reps - 5-10 second hold - Seated Hamstring Stretch  - 1-3 x daily - 7 x weekly - 3-5 reps - 30-60 seconds hold    PATIENT EDUCATION:  Education details: HEP, POC, goals Person educated:  Patient Education method: Explanation, Demonstration, and Handouts Education comprehension: verbalized understanding and returned demonstration  HOME EXERCISE PROGRAM: Access Code: [email or web address] URL: [email or web address] Date: 01/02/2024 Prepared by: Maryanne Finder  Exercises - Supine Lower Trunk Rotation  - 1-3 x daily - 7 x weekly - 10 reps - 5-10 second hold - Sidelying Thoracic Rotation with Open Book  - 1-3 x daily - 7 x weekly - 10 reps - 5-10 second hold - Seated Flexion Stretch with Swiss Ball  - 1-3 x daily - 7 x weekly - 10 reps - 5-10 second hold - Seated Hamstring Stretch  - 1-3 x daily - 7 x weekly - 3-5 reps - 30-60 seconds hold  ASSESSMENT:  CLINICAL IMPRESSION: Pt returning for first treatment. Completing 6 MWT and 30 sec STS test to assess function for ADL's. Pt below age matched norms for both tests indicative of LE weakness. Remainder of time spent reviewing HEP to ensure correct completion for home. Pt with excellent understanding of  exercises and reporting subjective LBP relief since provided. Patient will benefit from skilled PT interventions to address listed impairments to improve quality of life and reduce pain.   OBJECTIVE IMPAIRMENTS: decreased activity tolerance, decreased endurance, decreased mobility, difficulty walking, decreased strength, hypomobility, increased muscle spasms, impaired flexibility, and pain.   ACTIVITY LIMITATIONS: sitting, standing, sleeping, and locomotion level  PARTICIPATION LIMITATIONS: cleaning, laundry, community activity, occupation, and yard work  PERSONAL FACTORS: Age, Past/current experiences, and Time since onset of injury/illness/exacerbation are also affecting patient's functional outcome.   REHAB POTENTIAL: Good  CLINICAL DECISION MAKING: Stable/uncomplicated  EVALUATION COMPLEXITY: Low   GOALS: Goals reviewed with patient? Yes  SHORT TERM GOALS: Target date: 01/30/2024  Patient will be independent in  HEP to improve strength/mobility for better functional independence with ADLs.  Baseline: 01/02/24: HEP initiated Goal status: INITIAL   LONG TERM GOALS: Target date: 02/27/2024  Patient will reduce modified Oswestry score to <20 as to demonstrate minimal disability with ADLs including improved sleeping tolerance, walking/sitting tolerance etc for better mobility with ADLs. Baseline: 01/02/24: 26/50 = 52%  Goal status: INITIAL  2.  Patient will report a worst pain of <3/10 on NRPS in low back  to improve tolerance with ADLs and reduced symptoms with activities.   Baseline: 01/02/24: 9/10 pain Goal status: INITIAL  3.  Patient will be able to perform household work/ chores without increase in symptoms to >2/10 pain.  Baseline: 01/02/24: unable to stand and complete dishes for >10 minutes before laying down Goal status: INITIAL  4.  Patient will increase lumbar ROM to WNL to demonstrate improve muscular tightness and tolerance to movement. Baseline: 01/02/24: see above, limited due to pain and muscular tightness Goal status: INITIAL  5.  Patient will increase 30 second chair stand reps by 4 reps to demonstrate improved functional BLE strength and tolerance to activity.  Baseline: 01/02/24: to be completed visit #2; 01/07/24:  Goal status: INITIAL  6.  Patient will improve by 13m (164') in order to demonstrate clinically significant improvement in cardiopulmonary endurance and community ambulation  Baseline: 01/02/24: to be completed visit #2  Goal status: INITIAL  PLAN:  PT FREQUENCY: 1-2x/week  PT DURATION: 8 weeks  PLANNED INTERVENTIONS: 97164- PT Re-evaluation, 97750- Physical Performance Testing, 97110-Therapeutic exercises, 97530- Therapeutic activity, 97112- Neuromuscular re-education, 97535- Self Care, 02859- Manual therapy, G0283- Electrical stimulation (unattended), [id number]- Electrical stimulation (manual), 20560 (1-2 muscles), 20561 (3+ muscles)- Dry Needling,  Patient/Family education, Joint mobilization, Joint manipulation, Spinal manipulation, Spinal mobilization, Cryotherapy, and Moist heat.  PLAN FOR NEXT SESSION: Treadmill warm up, lumbar/hip mobility, BLE strengthening.    Dorina HERO. Fairly IV, PT, DPT Physical Therapist- [facility] 01/07/2024, 12:08 PM

## 2024-01-08 NOTE — Progress Notes (Unsigned)
     Barney Gertsch T. Jennette Leask, MD, CAQ Sports Medicine [facility] at [facility] [address] [place], [address]  Phone: [phone]  FAX: [phone]  Leslie Gomez - 58 y.o. female  MRN [id number]  Date of Birth: 08/11/1965  Date: 01/09/2024  PCP: Randeen Laine LABOR, MD  Referral: Randeen Laine LABOR, MD  No chief complaint on file.  Subjective:   Leslie Gomez is a 58 y.o. very pleasant female patient with There is no height or weight on file to calculate BMI. who presents with the following:  Discussed the use of AI scribe software for clinical note transcription with the patient, who gave verbal consent to proceed.  Patient is here for concussion follow-up.  I last saw her on December 19, 2023 for concussion.  When I last saw her, she was doing quite poorly with broad symptoms.  On exam, she had remarkable poor near point convergence, induction of symptoms with VOMS, and instability of balance.  She also did poorly on her cognitive testing of her scat 5.  She is currently on FMLA. History of Present Illness     Review of Systems is noted in the HPI, as appropriate  Objective:   LMP 04/11/2016 (Approximate)   GEN: No acute distress; alert,appropriate. PULM: Breathing comfortably in no respiratory distress PSYCH: Normally interactive.   Laboratory and Imaging Data:  Assessment and Plan:   No diagnosis found. Assessment & Plan   Medication Management during today's office visit: No orders of the defined types were placed in this encounter.  There are no discontinued medications.  Orders placed today for conditions managed today: No orders of the defined types were placed in this encounter.   Disposition: No follow-ups on file.  Dragon [employer] One speech-to-text software was used for transcription in this dictation.  Possible transcriptional errors can occur using Animal nutritionist.   Signed,  Jacques DASEN. Breianna Delfino,  MD   Outpatient Encounter Medications as of 01/09/2024  Medication Sig   atorvastatin  (LIPITOR) 10 MG tablet TAKE ONE-HALF TABLET BY MOUTH  DAILY   ferrous sulfate  325 (65 FE) MG tablet Take 1 tablet (325 mg total) by mouth daily.   methocarbamol (ROBAXIN) 500 MG tablet Take 1 tablet (500 mg total) by mouth every 8 (eight) hours as needed for muscle spasms (muscle pain / back pain/neck pain). Caution of sedation   traZODone (DESYREL) 50 MG tablet Take 1-2 tablets (50-100 mg total) by mouth at bedtime as needed for sleep.   No facility-administered encounter medications on file as of 01/09/2024.

## 2024-01-09 ENCOUNTER — Encounter: Payer: Self-pay | Admitting: Family Medicine

## 2024-01-09 ENCOUNTER — Ambulatory Visit: Admitting: Family Medicine

## 2024-01-09 VITALS — BP 110/70 | HR 74 | Temp 99.1°F | Ht 66.0 in | Wt 170.2 lb

## 2024-01-09 DIAGNOSIS — S060X0D Concussion without loss of consciousness, subsequent encounter: Secondary | ICD-10-CM | POA: Diagnosis not present

## 2024-01-09 DIAGNOSIS — F0781 Postconcussional syndrome: Secondary | ICD-10-CM

## 2024-01-10 ENCOUNTER — Ambulatory Visit

## 2024-01-10 ENCOUNTER — Encounter: Payer: Self-pay | Admitting: Physical Therapy

## 2024-01-10 DIAGNOSIS — M6281 Muscle weakness (generalized): Secondary | ICD-10-CM | POA: Diagnosis not present

## 2024-01-10 DIAGNOSIS — M5459 Other low back pain: Secondary | ICD-10-CM

## 2024-01-10 NOTE — Therapy (Signed)
 OUTPATIENT PHYSICAL THERAPY THORACOLUMBAR TREATMENT   Patient Name: Leslie Gomez MRN: [id number] DOB:25-Feb-1965, 58 y.o., female Today's Date: 01/10/2024  END OF SESSION:  PT End of Session - 01/10/24 1123     Visit Number 3    Number of Visits 17    Date for Recertification  02/27/24    PT Start Time 1121    PT Stop Time 1200    PT Time Calculation (min) 39 min    Activity Tolerance Patient tolerated treatment well    Behavior During Therapy [facility] for tasks assessed/performed          Past Medical History:  Diagnosis Date   Anemia    NOS since childhood runs in family   Hyperlipidemia    Past Surgical History:  Procedure Laterality Date   ABDOMINOPLASTY  2018   CESAREAN SECTION     COSMETIC SURGERY     WISDOM TOOTH EXTRACTION     Patient Active Problem List   Diagnosis Date Noted   Concussion 12/05/2023   Back pain 12/05/2023   History of fall 12/05/2023   Thrombocytopenia 09/02/2019   Prediabetes 08/27/2017   Colon cancer screening 05/18/2015   Encounter for routine gynecological examination 01/17/2011   Routine general medical examination at a health care facility 01/09/2011   HYPERCHOLESTEROLEMIA 11/19/2006   Anemia 11/19/2006    PCP: Randeen Laine LABOR, MD  REFERRING PROVIDER: Randeen Laine LABOR, MD  REFERRING DIAG:  [phone] (ICD-10-CM) - History of fall M54.9 (ICD-10-CM) - Acute back pain, unspecified back location, unspecified back pain laterality  Rationale for Evaluation and Treatment: Rehabilitation  THERAPY DIAG:  Muscle weakness (generalized)  Other low back pain  ONSET DATE: 12/01/23 following fall down stairs   SUBJECTIVE:                                                                                                                                                                                           SUBJECTIVE STATEMENT: Pt reports beginning to walk around her apartment complex. Had soreness after last session. Denies pain in  her lower back.  PERTINENT HISTORY:  On 12/02/23 was seen in the ED, patient had a slip and fall down stairs on 10/11. She complained of headache, back pain, and neck pain. She reported hitting her head and back on the stairs. Imaging was negative for acute fracture. Treated as concussion without loss of consciousness. Continues with ongoing headache, ringing in ears, and back pain.   PAIN:  Are you having pain? Yes: NPRS scale: 6/10 current; 9/10 worst Pain location: L lower back Pain description: aching  Aggravating factors: sitting, standing too long, first  thing in the morning when she is getting up Relieving factors: laying flat in living floor, hot shower, taking Aleve, prescribed muscle relaxer but this makes her drowsy.  PRECAUTIONS: None  RED FLAGS: None   WEIGHT BEARING RESTRICTIONS: No  FALLS:  Has patient fallen in last 6 months? Yes. 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  DIAGNOSTIC FINDINGS:  All imaging negative from ED visit on 12/02/23  PATIENT SURVEYS:  Modified Oswestry:  MODIFIED OSWESTRY DISABILITY SCALE  Date: 01/02/24 Score  Pain intensity 1 = The pain is bad, but I can manage without having to take pain medication  2. Personal care (washing, dressing, etc.) 0 =  I can take care of myself normally without causing increased pain.  3. Lifting 4 = I can lift only very light weights  4. Walking 2 =  Pain prevents me from walking more than  mile.  5. Sitting 4 =  Pain prevents me from sitting more than 10  minutes.  6. Standing 1 =  I can stand as long as I want but, it increases my pain.  7. Sleeping 2 =  Even when I take pain medication, I sleep less than 6 hours  8. Social Life 4 =  Pain has restricted my social life to my home  9. Traveling 4 = My pain restricts my travel to short necessary journeys under 1/2 hour.  10. Employment/ Homemaking 4 = Pain prevents me from doing even light duties.  Total 26/50 = 52%   Interpretation of scores: Score Category Description  0-20% Minimal Disability The patient can cope with most living activities. Usually no treatment is indicated apart from advice on lifting, sitting and exercise  21-40% Moderate Disability The patient experiences more pain and difficulty with sitting, lifting and standing. Travel and social life are more difficult and they may be disabled from work. Personal care, sexual activity and sleeping are not grossly affected, and the patient can usually be managed by conservative means  41-60% Severe Disability Pain remains the main problem in this group, but activities of daily living are affected. These patients require a detailed investigation  61-80% Crippled Back pain impinges on all aspects of the patient's life. Positive intervention is required  81-100% Bed-bound These patients are either bed-bound or exaggerating their symptoms  Bluford FORBES Zoe DELENA Karon DELENA, et al. Surgery versus conservative management of stable thoracolumbar fracture: the PRESTO feasibility RCT. [place] ([place]): [employer]; 2021 Nov. [facility] Technology Assessment, No. 25.62.) Appendix 3, Oswestry Disability Index category descriptors. Available from: [email or web address].cz  Minimally Clinically Important Difference (MCID) = 12.8%  COGNITION: Overall cognitive status: Within functional limits for tasks assessed     SENSATION: WFL  MUSCLE LENGTH: Hamstrings length limited bilaterally  POSTURE: No Significant postural  limitations  PALPATION: Significant TTP to L>R paraspinals (lumbar and thoracic) Unable to assess passive accessory motions of lumbar/thoracic spine   LUMBAR ROM:   AROM eval  Flexion 50%*  Extension 75%*  Right lateral flexion Fingertips to 3/4 of thigh   Left lateral  flexion Fingertips to 3/4 of thigh*  Right rotation 25%*  Left rotation 25%*   (Blank rows = not tested)  LOWER EXTREMITY MMT:    MMT Right eval Left eval  Hip flexion 4 4-  Hip extension    Hip abduction 4 4  Hip adduction 4+ 4+  Hip internal rotation    Hip external rotation    Knee flexion 4+ 4+  Knee extension 4+ 4  Ankle dorsiflexion 4+ 4+  Ankle plantarflexion    Ankle inversion    Ankle eversion     (Blank rows = not tested)  LUMBAR SPECIAL TESTS:  None tested   FUNCTIONAL TESTS:  30 seconds chair stand test: to be completed at visit #2; 01/07/24: 3 reps  6 minute walk test: to be completed visit #2; 01/07/24: 1,052'  GAIT: Distance walked: 80' Assistive device utilized: None Level of assistance: Complete Independence Comments: no significant gait deviations   TREATMENT DATE: 01/10/24                                                                                                                                There.Act:   TM gait warm up to mimic walking for exercise: 5 minutes, 1.5-2.0 MPH   Standing russian twists to work on obliques and improve turning for kitchen tasks: 3x8/side   STS with 2 KG med ball: 5x6  Seated paloff press with GTB: 3x12/side       PATIENT EDUCATION:  Education details: HEP, POC, goals Person educated: Patient Education method: Explanation, Demonstration, and Handouts Education comprehension: verbalized understanding and returned demonstration  HOME EXERCISE PROGRAM: Access Code: [email or web address] URL: [email or web address] Date: 01/10/2024 Prepared by: Dorina Kingfisher  Exercises - Supine Lower Trunk Rotation  - 1-3 x daily - 7 x weekly - 10  reps - 5-10 second hold - Sidelying Thoracic Rotation with Open Book  - 1-3 x daily - 7 x weekly - 10 reps - 5-10 second hold - Seated Flexion Stretch with Swiss Ball  - 1-3 x daily - 7 x weekly - 10 reps - 5-10 second hold - Seated Hamstring Stretch  - 1-3 x daily - 7 x weekly - 3-5 reps - 30-60 seconds hold - Sit to Stand Without Arm Support  - 1 x daily - 3 x weekly - 3 sets - 8 reps   Access Code: [email or web address] URL: [email or web address] Date: 01/02/2024 Prepared by: Maryanne Finder  Exercises - Supine Lower Trunk Rotation  - 1-3 x daily - 7 x weekly - 10 reps - 5-10 second hold - Sidelying Thoracic Rotation with Open Book  - 1-3 x daily - 7 x weekly - 10 reps - 5-10 second hold - Seated Flexion Stretch with Swiss Ball  - 1-3 x daily - 7 x weekly - 10 reps - 5-10 second hold - Seated Hamstring Stretch  - 1-3 x daily - 7 x weekly - 3-5 reps - 30-60 seconds hold  ASSESSMENT:  CLINICAL IMPRESSION: Continuing PT POC working on LE strength,core stability and rotation training for ADL completion. Pt able to complete standing rotation to mimic ADL's without onset of LBP. Initially reporting discomfort but ease by second set with improved confidence noted by end of set. Provided updated HEP to continue to progress POC. Educated pt to continue ambulation and benefits for LBP. Pt understanding. Patient will benefit from skilled PT interventions to address listed impairments to improve quality of life and reduce pain.   OBJECTIVE IMPAIRMENTS: decreased activity tolerance, decreased endurance, decreased mobility, difficulty walking, decreased strength, hypomobility, increased muscle spasms, impaired flexibility, and pain.   ACTIVITY LIMITATIONS: sitting, standing, sleeping, and locomotion level  PARTICIPATION LIMITATIONS: cleaning, laundry, community activity, occupation, and yard work  PERSONAL FACTORS: Age, Past/current experiences, and Time since onset of injury/illness/exacerbation  are also affecting patient's functional outcome.   REHAB POTENTIAL: Good  CLINICAL DECISION MAKING: Stable/uncomplicated  EVALUATION COMPLEXITY: Low   GOALS: Goals reviewed with patient? Yes  SHORT TERM GOALS: Target date: 01/30/2024  Patient will be independent in HEP to improve strength/mobility for better functional independence with ADLs.  Baseline: 01/02/24: HEP initiated Goal status: INITIAL   LONG TERM GOALS: Target date: 02/27/2024  Patient will reduce modified Oswestry score to <20 as to demonstrate minimal disability with ADLs including improved sleeping tolerance, walking/sitting tolerance etc for better mobility with ADLs. Baseline: 01/02/24: 26/50 = 52%  Goal status: INITIAL  2.  Patient will report a worst pain of <3/10 on NRPS in low back  to improve tolerance with ADLs and reduced symptoms with activities.   Baseline: 01/02/24: 9/10 pain Goal status: INITIAL  3.  Patient will be able to perform household work/ chores without increase in symptoms to >2/10 pain.  Baseline: 01/02/24: unable to stand and complete dishes for >10 minutes before laying down Goal status: INITIAL  4.  Patient will increase lumbar ROM to WNL to demonstrate improve muscular tightness and tolerance to movement. Baseline: 01/02/24: see above, limited due to pain and muscular tightness Goal status: INITIAL  5.  Patient will increase 30 second chair stand reps by 4 reps to demonstrate improved functional BLE strength and tolerance to activity.  Baseline: 01/02/24: to be completed visit #2; 01/07/24: 3 reps Goal status: INITIAL  6.  Patient will improve by 62m (164') in order to demonstrate clinically significant improvement in cardiopulmonary endurance and community ambulation  Baseline: 01/02/24: to be completed visit #2 ; 01/07/24: 1,052' Goal status: INITIAL  PLAN:  PT FREQUENCY: 1-2x/week  PT DURATION: 8 weeks  PLANNED INTERVENTIONS: 97164- PT Re-evaluation, 97750- Physical  Performance Testing, 97110-Therapeutic exercises, 97530- Therapeutic activity, 97112- Neuromuscular re-education, 97535- Self Care, [id number]- Manual therapy, [id number]- Electrical stimulation (unattended), [id number]- Electrical stimulation (manual), 20560 (1-2 muscles), 20561 (3+ muscles)- Dry Needling, Patient/Family education, Joint mobilization, Joint manipulation, Spinal manipulation, Spinal mobilization, Cryotherapy, and Moist heat.  PLAN FOR NEXT SESSION: Treadmill warm up, lumbar/hip mobility, BLE strengthening and core stability.    Dorina HERO. Fairly IV, PT, DPT Physical Therapist- [facility]  [facility] 01/10/2024, 2:37 PM

## 2024-01-14 NOTE — Therapy (Signed)
 OUTPATIENT PHYSICAL THERAPY THORACOLUMBAR TREATMENT   Patient Name: Leslie Gomez MRN: [id number] DOB:1965/03/13, 58 y.o., female Today's Date: 01/15/2024  END OF SESSION:  PT End of Session - 01/15/24 1551     Visit Number 4    Number of Visits 17    Date for Recertification  02/27/24    PT Start Time 1600    PT Stop Time 1641    PT Time Calculation (min) 41 min    Activity Tolerance Patient tolerated treatment well    Behavior During Therapy [facility] for tasks assessed/performed           Past Medical History:  Diagnosis Date   Anemia    NOS since childhood runs in family   Hyperlipidemia    Past Surgical History:  Procedure Laterality Date   ABDOMINOPLASTY  2018   CESAREAN SECTION     COSMETIC SURGERY     WISDOM TOOTH EXTRACTION     Patient Active Problem List   Diagnosis Date Noted   Concussion 12/05/2023   Back pain 12/05/2023   History of fall 12/05/2023   Thrombocytopenia 09/02/2019   Prediabetes 08/27/2017   Colon cancer screening 05/18/2015   Encounter for routine gynecological examination 01/17/2011   Routine general medical examination at a health care facility 01/09/2011   HYPERCHOLESTEROLEMIA 11/19/2006   Anemia 11/19/2006    PCP: Randeen Laine LABOR, MD  REFERRING PROVIDER: Randeen Laine LABOR, MD  REFERRING DIAG:  [phone] (ICD-10-CM) - History of fall M54.9 (ICD-10-CM) - Acute back pain, unspecified back location, unspecified back pain laterality  Rationale for Evaluation and Treatment: Rehabilitation  THERAPY DIAG:  Muscle weakness (generalized)  Other low back pain  ONSET DATE: 12/01/23 following fall down stairs   SUBJECTIVE:                                                                                                                                                                                           SUBJECTIVE STATEMENT:    Patient denies pain on arrival endorses 4/10 tightness in lower back  PERTINENT HISTORY:  On 12/02/23  was seen in the ED, patient had a slip and fall down stairs on 10/11. She complained of headache, back pain, and neck pain. She reported hitting her head and back on the stairs. Imaging was negative for acute fracture. Treated as concussion without loss of consciousness. Continues with ongoing headache, ringing in ears, and back pain.   PAIN:  Are you having pain? Yes: NPRS scale: 6/10 current; 9/10 worst Pain location: L lower back Pain description: aching  Aggravating factors: sitting, standing too long, first thing in the morning when  she is getting up Relieving factors: laying flat in living floor, hot shower, taking Aleve, prescribed muscle relaxer but this makes her drowsy.  PRECAUTIONS: None  RED FLAGS: None   WEIGHT BEARING RESTRICTIONS: No  FALLS:  Has patient fallen in last 6 months? Yes. 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  DIAGNOSTIC FINDINGS:  All imaging negative from ED visit on 12/02/23  PATIENT SURVEYS:  Modified Oswestry:  MODIFIED OSWESTRY DISABILITY SCALE  Date: 01/02/24 Score  Pain intensity 1 = The pain is bad, but I can manage without having to take pain medication  2. Personal care (washing, dressing, etc.) 0 =  I can take care of myself normally without causing increased pain.  3. Lifting 4 = I can lift only very light weights  4. Walking 2 =  Pain prevents me from walking more than  mile.  5. Sitting 4 =  Pain prevents me from sitting more than 10 minutes.  6. Standing 1 =  I can stand as long as I  want but, it increases my pain.  7. Sleeping 2 =  Even when I take pain medication, I sleep less than 6 hours  8. Social Life 4 =  Pain has restricted my social life to my home  9. Traveling 4 = My pain restricts my travel to short necessary journeys under 1/2 hour.  10. Employment/ Homemaking 4 = Pain prevents me from doing even light duties.  Total 26/50 = 52%   Interpretation of scores: Score Category Description  0-20% Minimal Disability The patient can cope with most living activities. Usually no treatment is indicated apart from advice on lifting, sitting and exercise  21-40% Moderate Disability The patient experiences more pain and difficulty with sitting, lifting and standing. Travel and social life are more difficult and they may be disabled from work. Personal care, sexual activity and sleeping are not grossly affected, and the patient can usually be managed by conservative means  41-60% Severe Disability Pain remains the main problem in this group, but activities of daily living are affected. These patients require a detailed investigation  61-80% Crippled Back pain impinges on all aspects of the patient's life. Positive intervention is required  81-100% Bed-bound These patients are either bed-bound or exaggerating their symptoms  Bluford FORBES Zoe DELENA Karon DELENA, et al. Surgery versus conservative management of stable thoracolumbar fracture: the PRESTO feasibility RCT. [place] ([place]): [employer]; 2021 Nov. [facility] Technology Assessment, No. 25.62.) Appendix 3, Oswestry Disability Index category descriptors. Available from: [email or web address].cz  Minimally Clinically Important Difference (MCID) = 12.8%  COGNITION: Overall cognitive status: Within functional limits for tasks assessed     SENSATION: WFL  MUSCLE LENGTH: Hamstrings length limited bilaterally  POSTURE: No Significant postural limitations  PALPATION: Significant TTP to L>R  paraspinals (lumbar and thoracic) Unable to assess passive accessory motions of lumbar/thoracic spine   LUMBAR ROM:   AROM eval  Flexion 50%*  Extension 75%*  Right lateral flexion Fingertips to 3/4 of thigh   Left lateral flexion Fingertips to 3/4 of  thigh*  Right rotation 25%*  Left rotation 25%*   (Blank rows = not tested)  LOWER EXTREMITY MMT:    MMT Right eval Left eval  Hip flexion 4 4-  Hip extension    Hip abduction 4 4  Hip adduction 4+ 4+  Hip internal rotation    Hip external rotation    Knee flexion 4+ 4+  Knee extension 4+ 4  Ankle dorsiflexion 4+ 4+  Ankle plantarflexion    Ankle inversion    Ankle eversion     (Blank rows = not tested)  LUMBAR SPECIAL TESTS:  None tested   FUNCTIONAL TESTS:  30 seconds chair stand test: to be completed at visit #2; 01/07/24: 3 reps  6 minute walk test: to be completed visit #2; 01/07/24: 1,052'  GAIT: Distance walked: 5' Assistive device utilized: None Level of assistance: Complete Independence Comments: no significant gait deviations   TREATMENT DATE: 01/15/24                                                                                                                                There.Act:   TM gait warm up to mimic walking for exercise: 5 minutes, 1.5-2.0 MPH   Seated russian twists with 2kg med ball 3 x 10   STS with 2 KG med ball chest press: 3 x 10   Seated paloff press with GTB: 3x12/side  Seated B shoulder extension with GTB 3 x 10 - focus on core activation     PATIENT EDUCATION:  Education details: HEP, POC, goals Person educated: Patient Education method: Explanation, Demonstration, and Handouts Education comprehension: verbalized understanding and returned demonstration  HOME EXERCISE PROGRAM: Access Code: [email or web address] URL: [email or web address] Date: 01/10/2024 Prepared by: Dorina Kingfisher  Exercises - Supine Lower Trunk Rotation  - 1-3 x daily - 7 x weekly - 10 reps - 5-10  second hold - Sidelying Thoracic Rotation with Open Book  - 1-3 x daily - 7 x weekly - 10 reps - 5-10 second hold - Seated Flexion Stretch with Swiss Ball  - 1-3 x daily - 7 x weekly - 10 reps - 5-10 second hold - Seated Hamstring Stretch  - 1-3 x daily - 7 x weekly - 3-5 reps - 30-60 seconds hold - Sit to Stand Without Arm Support  - 1 x daily - 3 x weekly - 3 sets - 8 reps   Access Code: [email or web address] URL: [email or web address] Date: 01/02/2024 Prepared by: Maryanne Finder  Exercises - Supine Lower Trunk Rotation  - 1-3 x daily - 7 x weekly - 10 reps - 5-10 second hold - Sidelying Thoracic Rotation with Open Book  - 1-3 x daily - 7 x weekly - 10 reps - 5-10 second hold - Seated Flexion Stretch with Swiss Ball  - 1-3 x daily - 7 x weekly - 10 reps - 5-10 second hold - Seated Hamstring Stretch  - 1-3 x daily - 7 x weekly -  3-5 reps - 30-60 seconds hold  ASSESSMENT:  CLINICAL IMPRESSION:   Continuing PT POC working on LE strength,core stability and rotation training for ADL completion. Session focused on core and BLE strengthening. Good tolerance to activity with no increase in LBP. Discussed proper lifting mechanics to avoid twisting, patient verbalized understanding. Patient will benefit from skilled PT interventions to address listed impairments to improve quality of life and reduce pain.   OBJECTIVE IMPAIRMENTS: decreased activity tolerance, decreased endurance, decreased mobility, difficulty walking, decreased strength, hypomobility, increased muscle spasms, impaired flexibility, and pain.   ACTIVITY LIMITATIONS: sitting, standing, sleeping, and locomotion level  PARTICIPATION LIMITATIONS: cleaning, laundry, community activity, occupation, and yard work  PERSONAL FACTORS: Age, Past/current experiences, and Time since onset of injury/illness/exacerbation are also affecting patient's functional outcome.   REHAB POTENTIAL: Good  CLINICAL DECISION MAKING:  Stable/uncomplicated  EVALUATION COMPLEXITY: Low   GOALS: Goals reviewed with patient? Yes  SHORT TERM GOALS: Target date: 01/30/2024  Patient will be independent in HEP to improve strength/mobility for better functional independence with ADLs.  Baseline: 01/02/24: HEP initiated Goal status: INITIAL   LONG TERM GOALS: Target date: 02/27/2024  Patient will reduce modified Oswestry score to <20 as to demonstrate minimal disability with ADLs including improved sleeping tolerance, walking/sitting tolerance etc for better mobility with ADLs. Baseline: 01/02/24: 26/50 = 52%  Goal status: INITIAL  2.  Patient will report a worst pain of <3/10 on NRPS in low back  to improve tolerance with ADLs and reduced symptoms with activities.   Baseline: 01/02/24: 9/10 pain Goal status: INITIAL  3.  Patient will be able to perform household work/ chores without increase in symptoms to >2/10 pain.  Baseline: 01/02/24: unable to stand and complete dishes for >10 minutes before laying down Goal status: INITIAL  4.  Patient will increase lumbar ROM to WNL to demonstrate improve muscular tightness and tolerance to movement. Baseline: 01/02/24: see above, limited due to pain and muscular tightness Goal status: INITIAL  5.  Patient will increase 30 second chair stand reps by 4 reps to demonstrate improved functional BLE strength and tolerance to activity.  Baseline: 01/02/24: to be completed visit #2; 01/07/24: 3 reps Goal status: INITIAL  6.  Patient will improve by 4m (164') in order to demonstrate clinically significant improvement in cardiopulmonary endurance and community ambulation  Baseline: 01/02/24: to be completed visit #2 ; 01/07/24: 1,052' Goal status: INITIAL  PLAN:  PT FREQUENCY: 1-2x/week  PT DURATION: 8 weeks  PLANNED INTERVENTIONS: 97164- PT Re-evaluation, 97750- Physical Performance Testing, 97110-Therapeutic exercises, 97530- Therapeutic activity, 97112- Neuromuscular  re-education, 97535- Self Care, [id number]- Manual therapy, [id number]- Electrical stimulation (unattended), [id number]- Electrical stimulation (manual), 20560 (1-2 muscles), 20561 (3+ muscles)- Dry Needling, Patient/Family education, Joint mobilization, Joint manipulation, Spinal manipulation, Spinal mobilization, Cryotherapy, and Moist heat.  PLAN FOR NEXT SESSION: Treadmill warm up, lumbar/hip mobility, BLE strengthening and core stability.    Maryanne Finder, PT, DPT Physical Therapist - [facility] 01/15/2024, 3:51 PM

## 2024-01-15 ENCOUNTER — Ambulatory Visit

## 2024-01-15 ENCOUNTER — Encounter: Payer: Self-pay | Admitting: Physical Therapy

## 2024-01-15 DIAGNOSIS — M6281 Muscle weakness (generalized): Secondary | ICD-10-CM

## 2024-01-15 DIAGNOSIS — M5459 Other low back pain: Secondary | ICD-10-CM

## 2024-01-21 ENCOUNTER — Encounter: Payer: Self-pay | Admitting: Physical Therapy

## 2024-01-21 ENCOUNTER — Ambulatory Visit: Attending: Family Medicine

## 2024-01-21 DIAGNOSIS — M5459 Other low back pain: Secondary | ICD-10-CM | POA: Diagnosis present

## 2024-01-21 DIAGNOSIS — M6281 Muscle weakness (generalized): Secondary | ICD-10-CM | POA: Insufficient documentation

## 2024-01-21 NOTE — Therapy (Signed)
 OUTPATIENT PHYSICAL THERAPY THORACOLUMBAR TREATMENT   Patient Name: Leslie Gomez MRN: [id number] DOB:01-08-1966, 58 y.o., female Today's Date: 01/21/2024  END OF SESSION:  PT End of Session - 01/21/24 0946     Visit Number 5    Number of Visits 17    Date for Recertification  02/27/24    PT Start Time 0945    PT Stop Time 1030    PT Time Calculation (min) 45 min    Activity Tolerance Patient tolerated treatment well    Behavior During Therapy [facility] for tasks assessed/performed           Past Medical History:  Diagnosis Date   Anemia    NOS since childhood runs in family   Hyperlipidemia    Past Surgical History:  Procedure Laterality Date   ABDOMINOPLASTY  2018   CESAREAN SECTION     COSMETIC SURGERY     WISDOM TOOTH EXTRACTION     Patient Active Problem List   Diagnosis Date Noted   Concussion 12/05/2023   Back pain 12/05/2023   History of fall 12/05/2023   Thrombocytopenia 09/02/2019   Prediabetes 08/27/2017   Colon cancer screening 05/18/2015   Encounter for routine gynecological examination 01/17/2011   Routine general medical examination at a health care facility 01/09/2011   HYPERCHOLESTEROLEMIA 11/19/2006   Anemia 11/19/2006    PCP: Randeen Laine LABOR, MD  REFERRING PROVIDER: Randeen Laine LABOR, MD  REFERRING DIAG:  [phone] (ICD-10-CM) - History of fall M54.9 (ICD-10-CM) - Acute back pain, unspecified back location, unspecified back pain laterality  Rationale for Evaluation and Treatment: Rehabilitation  THERAPY DIAG:  Muscle weakness (generalized)  Other low back pain  ONSET DATE: 12/01/23 following fall down stairs   SUBJECTIVE:                                                                                                                                                                                           SUBJECTIVE STATEMENT:    Patient reports mild stiffness in her lower back. Has increase in pain after PT but is slowly improving.    PERTINENT HISTORY:  On 12/02/23 was seen in the ED, patient had a slip and fall down stairs on 10/11. She complained of headache, back pain, and neck pain. She reported hitting her head and back on the stairs. Imaging was negative for acute fracture. Treated as concussion without loss of consciousness. Continues with ongoing headache, ringing in ears, and back pain.   PAIN:  Are you having pain? Yes: NPRS scale: 6/10 current; 9/10 worst Pain location: L lower back Pain description: aching  Aggravating factors: sitting, standing  too long, first thing in the morning when she is getting up Relieving factors: laying flat in living floor, hot shower, taking Aleve, prescribed muscle relaxer but this makes her drowsy.  PRECAUTIONS: None  RED FLAGS: None   WEIGHT BEARING RESTRICTIONS: No  FALLS:  Has patient fallen in last 6 months? Yes. 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  DIAGNOSTIC FINDINGS:  All imaging negative from ED visit on 12/02/23  PATIENT SURVEYS:  Modified Oswestry:  MODIFIED OSWESTRY DISABILITY SCALE  Date: 01/02/24 Score  Pain intensity 1 = The pain is bad, but I can manage without having to take pain medication  2. Personal care (washing, dressing, etc.) 0 =  I can take care of myself normally without causing increased pain.  3. Lifting 4 = I can lift only very light weights  4. Walking 2 =  Pain prevents me from walking more than  mile.  5. Sitting 4 =  Pain prevents me from sitting more than 10 minutes.  6.  Standing 1 =  I can stand as long as I want but, it increases my pain.  7. Sleeping 2 =  Even when I take pain medication, I sleep less than 6 hours  8. Social Life 4 =  Pain has restricted my social life to my home  9. Traveling 4 = My pain restricts my travel to short necessary journeys under 1/2 hour.  10. Employment/ Homemaking 4 = Pain prevents me from doing even light duties.  Total 26/50 = 52%   Interpretation of scores: Score Category Description  0-20% Minimal Disability The patient can cope with most living activities. Usually no treatment is indicated apart from advice on lifting, sitting and exercise  21-40% Moderate Disability The patient experiences more pain and difficulty with sitting, lifting and standing. Travel and social life are more difficult and they may be disabled from work. Personal care, sexual activity and sleeping are not grossly affected, and the patient can usually be managed by conservative means  41-60% Severe Disability Pain remains the main problem in this group, but activities of daily living are affected. These patients require a detailed investigation  61-80% Crippled Back pain impinges on all aspects of the patient's life. Positive intervention is required  81-100% Bed-bound These patients are either bed-bound or exaggerating their symptoms  Bluford FORBES Zoe DELENA Karon DELENA, et al. Surgery versus conservative management of stable thoracolumbar fracture: the PRESTO feasibility RCT. [place] ([place]): [employer]; 2021 Nov. [facility] Technology Assessment, No. 25.62.) Appendix 3, Oswestry Disability Index category descriptors. Available from: Findjewelers.cz  Minimally Clinically Important Difference (MCID) = 12.8%  COGNITION: Overall cognitive status: Within functional limits for tasks assessed     SENSATION: WFL  MUSCLE LENGTH: Hamstrings length limited bilaterally  POSTURE: No Significant postural  limitations  PALPATION: Significant TTP to L>R paraspinals (lumbar and thoracic) Unable to assess passive accessory motions of lumbar/thoracic spine   LUMBAR ROM:   AROM eval  Flexion 50%*  Extension 75%*  Right lateral flexion Fingertips to 3/4 of thigh  Left lateral flexion Fingertips to 3/4 of thigh*  Right rotation 25%*  Left rotation 25%*   (Blank rows = not tested)  LOWER EXTREMITY MMT:    MMT Right eval Left eval  Hip flexion 4 4-  Hip extension    Hip abduction 4 4  Hip adduction 4+ 4+  Hip internal rotation    Hip external rotation    Knee flexion 4+ 4+  Knee extension 4+ 4  Ankle dorsiflexion 4+ 4+  Ankle plantarflexion    Ankle inversion    Ankle eversion     (Blank rows = not tested)  LUMBAR SPECIAL TESTS:  None tested   FUNCTIONAL TESTS:  30 seconds chair stand test: to be completed at visit #2; 01/07/24: 3 reps  6 minute walk test: to be completed visit #2; 01/07/24: 1,052'  GAIT: Distance walked: 69' Assistive device utilized: None Level of assistance: Complete Independence Comments: no significant gait deviations   TREATMENT DATE: 01/21/24                                                                                                                                There.Act:   TM gait warm up to mimic walking for exercise: 5 minutes, 1.5 - 2.0 MPH   Seated russian twists with 2kg med ball 3 x 10   STS with 2 KG med ball chest press: 3 x 10   Seated paloff press with GTB: 3x12/side  Seated B shoulder extension with GTB 3 x 10. Min VC's for pelvic positioning with excellent carryover.   Side stepping with 4# AW's: down and back x2 along 10 meters     PATIENT EDUCATION:  Education details: HEP, POC, goals Person educated: Patient Education method: Explanation, Demonstration, and Handouts Education comprehension: verbalized understanding and returned demonstration  HOME EXERCISE PROGRAM: Access Code: [email or web address] URL:  [email or web address] Date: 01/10/2024 Prepared by: Dorina Kingfisher  Exercises - Supine Lower Trunk Rotation  - 1-3 x daily - 7 x weekly - 10 reps - 5-10 second hold - Sidelying Thoracic Rotation with Open Book  - 1-3 x daily - 7 x weekly - 10 reps - 5-10 second hold - Seated Flexion Stretch with Swiss Ball  - 1-3 x daily - 7 x weekly - 10 reps - 5-10 second hold - Seated Hamstring Stretch  - 1-3 x daily - 7 x weekly - 3-5 reps - 30-60 seconds hold - Sit to Stand Without Arm Support  - 1 x daily - 3 x weekly - 3 sets - 8 reps   Access Code: [email or web address] URL: [email or web address] Date: 01/02/2024 Prepared by: Maryanne Finder  Exercises - Supine Lower Trunk Rotation  - 1-3 x daily - 7 x weekly - 10 reps - 5-10 second hold - Sidelying Thoracic Rotation with Open Book  - 1-3 x daily - 7 x weekly - 10 reps - 5-10 second hold - Seated Flexion Stretch with [employer]  - 1-3  x daily - 7 x weekly - 10 reps - 5-10 second hold - Seated Hamstring Stretch  - 1-3 x daily - 7 x weekly - 3-5 reps - 30-60 seconds hold  ASSESSMENT:  CLINICAL IMPRESSION:   Continuing PT POC working on LE strength,core stability and rotation training for ADL completion. Session focused on core and BLE strengthening. Pt continues to tolerate progression of exercises and will benefit from updated HEP for core stability and posterior chain strengthening to improve LBP with ADL completion. Patient will benefit from skilled PT interventions to address listed impairments to improve quality of life and reduce pain.   OBJECTIVE IMPAIRMENTS: decreased activity tolerance, decreased endurance, decreased mobility, difficulty walking, decreased strength, hypomobility, increased muscle spasms, impaired flexibility, and pain.   ACTIVITY LIMITATIONS: sitting, standing, sleeping, and locomotion level  PARTICIPATION LIMITATIONS: cleaning, laundry, community activity, occupation, and yard work  PERSONAL FACTORS: Age,  Past/current experiences, and Time since onset of injury/illness/exacerbation are also affecting patient's functional outcome.   REHAB POTENTIAL: Good  CLINICAL DECISION MAKING: Stable/uncomplicated  EVALUATION COMPLEXITY: Low   GOALS: Goals reviewed with patient? Yes  SHORT TERM GOALS: Target date: 01/30/2024  Patient will be independent in HEP to improve strength/mobility for better functional independence with ADLs.  Baseline: 01/02/24: HEP initiated Goal status: INITIAL   LONG TERM GOALS: Target date: 02/27/2024  Patient will reduce modified Oswestry score to <20 as to demonstrate minimal disability with ADLs including improved sleeping tolerance, walking/sitting tolerance etc for better mobility with ADLs. Baseline: 01/02/24: 26/50 = 52%  Goal status: INITIAL  2.  Patient will report a worst pain of <3/10 on NRPS in low back  to improve tolerance with ADLs and reduced symptoms with activities.   Baseline: 01/02/24: 9/10 pain Goal status: INITIAL  3.  Patient will be able to perform household work/ chores without increase in symptoms to >2/10 pain.  Baseline: 01/02/24: unable to stand and complete dishes for >10 minutes before laying down Goal status: INITIAL  4.  Patient will increase lumbar ROM to WNL to demonstrate improve muscular tightness and tolerance to movement. Baseline: 01/02/24: see above, limited due to pain and muscular tightness Goal status: INITIAL  5.  Patient will increase 30 second chair stand reps by 4 reps to demonstrate improved functional BLE strength and tolerance to activity.  Baseline: 01/02/24: to be completed visit #2; 01/07/24: 3 reps Goal status: INITIAL  6.  Patient will improve by 27m (164') in order to demonstrate clinically significant improvement in cardiopulmonary endurance and community ambulation  Baseline: 01/02/24: to be completed visit #2 ; 01/07/24: 1,052' Goal status: INITIAL  PLAN:  PT FREQUENCY: 1-2x/week  PT  DURATION: 8 weeks  PLANNED INTERVENTIONS: 97164- PT Re-evaluation, 97750- Physical Performance Testing, 97110-Therapeutic exercises, 97530- Therapeutic activity, 97112- Neuromuscular re-education, 97535- Self Care, [id number]- Manual therapy, [id number]- Electrical stimulation (unattended), [id number]- Electrical stimulation (manual), 20560 (1-2 muscles), 20561 (3+ muscles)- Dry Needling, Patient/Family education, Joint mobilization, Joint manipulation, Spinal manipulation, Spinal mobilization, Cryotherapy, and Moist heat.  PLAN FOR NEXT SESSION: Update HEP. Treadmill warm up, lumbar/hip mobility, BLE strengthening and core stability.    Dorina HERO. Fairly IV, PT, DPT Physical Therapist- [place]  [facility] 01/21/2024, 12:10 PM

## 2024-01-24 ENCOUNTER — Ambulatory Visit: Admitting: Physical Therapy

## 2024-01-24 ENCOUNTER — Encounter: Payer: Self-pay | Admitting: Physical Therapy

## 2024-01-24 DIAGNOSIS — M6281 Muscle weakness (generalized): Secondary | ICD-10-CM

## 2024-01-24 DIAGNOSIS — M5459 Other low back pain: Secondary | ICD-10-CM

## 2024-01-24 NOTE — Therapy (Addendum)
 OUTPATIENT PHYSICAL THERAPY THORACOLUMBAR TREATMENT   Patient Name: Leslie Gomez MRN: [id number] DOB:Aug 23, 1965, 58 y.o., female Today's Date: 01/24/2024  END OF SESSION:  PT End of Session - 01/24/24 0826     Visit Number 6    Number of Visits 17    Date for Recertification  02/27/24    Authorization - Number of Visits 6    Progress Note Due on Visit 10    PT Start Time 0820    PT Stop Time 0900    PT Time Calculation (min) 40 min    Activity Tolerance Patient tolerated treatment well    Behavior During Therapy [facility] for tasks assessed/performed           Past Medical History:  Diagnosis Date   Anemia    NOS since childhood runs in family   Hyperlipidemia    Past Surgical History:  Procedure Laterality Date   ABDOMINOPLASTY  2018   CESAREAN SECTION     COSMETIC SURGERY     WISDOM TOOTH EXTRACTION     Patient Active Problem List   Diagnosis Date Noted   Concussion 12/05/2023   Back pain 12/05/2023   History of fall 12/05/2023   Thrombocytopenia 09/02/2019   Prediabetes 08/27/2017   Colon cancer screening 05/18/2015   Encounter for routine gynecological examination 01/17/2011   Routine general medical examination at a health care facility 01/09/2011   HYPERCHOLESTEROLEMIA 11/19/2006   Anemia 11/19/2006    PCP: Randeen Laine LABOR, MD  REFERRING PROVIDER: Randeen Laine LABOR, MD  REFERRING DIAG:  [phone] (ICD-10-CM) - History of fall M54.9 (ICD-10-CM) - Acute back pain, unspecified back location, unspecified back pain laterality  Rationale for Evaluation and Treatment: Rehabilitation  THERAPY DIAG:  Muscle weakness (generalized)  Other low back pain  ONSET DATE: 12/01/23 following fall down stairs   SUBJECTIVE:                                                                                                                                                                                           SUBJECTIVE STATEMENT:    Pt reports increased pain in low  back today and she attributes this to it being a cold morning.   PERTINENT HISTORY:  On 12/02/23 was seen in the ED, patient had a slip and fall down stairs on 10/11. She complained of headache, back pain, and neck pain. She reported hitting her head and back on the stairs. Imaging was negative for acute fracture. Treated as concussion without loss of consciousness. Continues with ongoing headache, ringing in ears, and back pain.   PAIN:  Are you having pain? Yes: NPRS  scale: 6/10 current; 9/10 worst Pain location: L lower back Pain description: aching  Aggravating factors: sitting, standing too long, first thing in the morning when she is getting up Relieving factors: laying flat in living floor, hot shower, taking Aleve, prescribed muscle relaxer but this makes her drowsy.  PRECAUTIONS: None  RED FLAGS: None   WEIGHT BEARING RESTRICTIONS: No  FALLS:  Has patient fallen in last 6 months? Yes. 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  DIAGNOSTIC FINDINGS:  All imaging negative from ED visit on 12/02/23  PATIENT SURVEYS:  Modified Oswestry:  MODIFIED OSWESTRY DISABILITY SCALE  Date: 01/02/24 Score  Pain intensity 1 = The pain is bad, but I can manage without having to take pain medication  2. Personal care (washing, dressing, etc.) 0 =  I can take care of myself normally without causing increased pain.  3. Lifting 4 = I can lift only very light weights  4. Walking 2 =  Pain prevents me from walking more than  mile.  5. Sitting 4 =   Pain prevents me from sitting more than 10 minutes.  6. Standing 1 =  I can stand as long as I want but, it increases my pain.  7. Sleeping 2 =  Even when I take pain medication, I sleep less than 6 hours  8. Social Life 4 =  Pain has restricted my social life to my home  9. Traveling 4 = My pain restricts my travel to short necessary journeys under 1/2 hour.  10. Employment/ Homemaking 4 = Pain prevents me from doing even light duties.  Total 26/50 = 52%   Interpretation of scores: Score Category Description  0-20% Minimal Disability The patient can cope with most living activities. Usually no treatment is indicated apart from advice on lifting, sitting and exercise  21-40% Moderate Disability The patient experiences more pain and difficulty with sitting, lifting and standing. Travel and social life are more difficult and they may be disabled from work. Personal care, sexual activity and sleeping are not grossly affected, and the patient can usually be managed by conservative means  41-60% Severe Disability Pain remains the main problem in this group, but activities of daily living are affected. These patients require a detailed investigation  61-80% Crippled Back pain impinges on all aspects of the patient's life. Positive intervention is required  81-100% Bed-bound These patients are either bed-bound or exaggerating their symptoms  Bluford FORBES Zoe DELENA Karon DELENA, et al. Surgery versus conservative management of stable thoracolumbar fracture: the PRESTO feasibility RCT. [place] ([place]): [employer]; 2021 Nov. [employer] Technology Assessment, No. 25.62.) Appendix 3, Oswestry Disability Index category descriptors. Available from: Findjewelers.cz  Minimally Clinically Important Difference (MCID) = 12.8%  COGNITION: Overall cognitive status: Within functional limits for tasks assessed     SENSATION: WFL  MUSCLE LENGTH: Hamstrings length limited  bilaterally  POSTURE: No Significant postural limitations  PALPATION: Significant TTP to L>R paraspinals (lumbar and thoracic) Unable to assess passive accessory motions of lumbar/thoracic spine   LUMBAR ROM:  AROM eval 01/24/24  Flexion 50%* 75%*  Extension 75%* 100%  Right lateral flexion Fingertips to 3/4 of thigh  100%*  Left lateral flexion Fingertips to 3/4 of thigh* 100%  Right rotation 25%* 75%*  Left rotation 25%* 75%*   (Blank rows = not tested)  LOWER EXTREMITY MMT:    MMT Right eval Left eval  Hip flexion 4 4-  Hip extension    Hip abduction 4 4  Hip adduction 4+ 4+  Hip internal rotation    Hip external rotation    Knee flexion 4+ 4+  Knee extension 4+ 4  Ankle dorsiflexion 4+ 4+  Ankle plantarflexion    Ankle inversion    Ankle eversion     (Blank rows = not tested)  LUMBAR SPECIAL TESTS:  None tested   FUNCTIONAL TESTS:  30 seconds chair stand test: to be completed at visit #2; 01/07/24: 3 reps  6 minute walk test: to be completed visit #2; 01/07/24: 1,052'  GAIT: Distance walked: 72' Assistive device utilized: None Level of assistance: Complete Independence Comments: no significant gait deviations   TREATMENT DATE: 01/24/24         THEREX   TM Warm Up 4 mph for 5 min   Quadruped Hip Extension  2 x 10   Child's Pose Center 3 x 30 sec   Child's Pose Right Side Bending  3 x 30 sec    Child's Pose Left Side Bending 3 x 30 sec    Cat Cow with 3 sec hold during each pose 1 x 10   Modified Oswestry Disability Index :  12/50 (24%) Lumbar AROM:  See Above   Palpation: TTP on right lumbar paraspinal with noted increased tension                                                                                                                             PATIENT EDUCATION:  Education details: HEP, POC, goals Person educated: Patient Education method: Explanation, Demonstration, and Handouts Education comprehension: verbalized understanding and  returned demonstration  HOME EXERCISE PROGRAM: Access Code: [email or web address] URL: [email or web address] Date: 01/10/2024 Prepared by: Dorina Kingfisher  Exercises - Supine Lower Trunk Rotation  - 1-3 x daily - 7 x weekly - 10 reps - 5-10 second hold - Sidelying Thoracic Rotation with Open Book  - 1-3 x daily - 7 x weekly - 10 reps - 5-10 second hold - Seated Flexion Stretch with Swiss Ball  - 1-3 x daily - 7 x weekly - 10 reps - 5-10 second hold - Seated Hamstring Stretch  - 1-3 x daily - 7 x weekly - 3-5 reps - 30-60 seconds hold - Sit to Stand Without Arm Support  - 1 x daily - 3 x weekly - 3 sets - 8 reps   Access Code: [email or web address] URL: [email or web address] Date: 01/02/2024 Prepared by: Maryanne Finder  Exercises - Supine Lower Trunk Rotation  - 1-3 x daily - 7 x weekly -  10 reps - 5-10 second hold - Sidelying Thoracic Rotation with Open Book  - 1-3 x daily - 7 x weekly - 10 reps - 5-10 second hold - Seated Flexion Stretch with Swiss Ball  - 1-3 x daily - 7 x weekly - 10 reps - 5-10 second hold - Seated Hamstring Stretch  - 1-3 x daily - 7 x weekly - 3-5 reps - 30-60 seconds hold  ASSESSMENT:  CLINICAL IMPRESSION:   Pt progressing towards goals with improvement in perception of low back function, decrease in low back pain with movement, and increase in lumbar mobility. She continues to experience low back pain that is likely resulting from prior strain and that is improving with time and ongoing stretching of lumbar musculature. Unclear of whether there is intra-articular involvement, but unlikely.She will continue to benefit from skilled PT interventions to address listed impairments to improve quality of life and reduce pain.   OBJECTIVE IMPAIRMENTS: decreased activity tolerance, decreased endurance, decreased mobility, difficulty walking, decreased strength, hypomobility, increased muscle spasms, impaired flexibility, and pain.   ACTIVITY LIMITATIONS: sitting,  standing, sleeping, and locomotion level  PARTICIPATION LIMITATIONS: cleaning, laundry, community activity, occupation, and yard work  PERSONAL FACTORS: Age, Past/current experiences, and Time since onset of injury/illness/exacerbation are also affecting patient's functional outcome.   REHAB POTENTIAL: Good  CLINICAL DECISION MAKING: Stable/uncomplicated  EVALUATION COMPLEXITY: Low   GOALS: Goals reviewed with patient? Yes  SHORT TERM GOALS: Target date: 01/30/2024  Patient will be independent in HEP to improve strength/mobility for better functional independence with ADLs.  Baseline: 01/02/24: HEP initiated 01/24/24: Performing independently Goal status: ACHIEVED     LONG TERM GOALS: Target date: 02/27/2024  Patient will reduce modified Oswestry score to <20 as to demonstrate minimal disability with ADLs including improved sleeping tolerance, walking/sitting tolerance etc for better mobility with ADLs. Baseline: 01/02/24: 26/50 = 52%    01/24/24: 12/50 (24%) Goal status: ACHIEVED    2.  Patient will report a worst pain of <3/10 on NRPS in low back  to improve tolerance with ADLs and reduced symptoms with activities.   Baseline: 01/02/24: 9/10 pain  01/24/24: 4/10 in central spinous  process    Goal status: PROGRESSING    3.  Patient will be able to perform household work/ chores without increase in symptoms to >2/10 pain.  Baseline: 01/02/24: unable to stand and complete dishes for >10 minutes before laying down  01/24/24: 5/10 NRPS , but she does not have to lay down anymore to take breaks.   Goal status: PROGRESSING    4.  Patient will increase lumbar ROM to WNL to demonstrate improve muscular tightness and tolerance to movement. Baseline: 01/02/24: see above, limited due to pain and muscular tightness12/4/25: Progressing see lumbar ROM above   Goal status: PROGRESSING    5.  Patient will increase 30 second chair stand reps by 4 reps to demonstrate improved functional BLE  strength and tolerance to activity.  Baseline: 01/02/24: to be completed visit #2; 01/07/24: 3 reps Goal status: ONGOING    6.  Patient will improve by 4m (164') in order to demonstrate clinically significant improvement in cardiopulmonary endurance and community ambulation  Baseline: 01/02/24: to be completed visit #2 ; 01/07/24: 1,052' Goal status: ONGOING    PLAN:  PT FREQUENCY: 1-2x/week  PT DURATION: 8 weeks  PLANNED INTERVENTIONS: 97164- PT Re-evaluation, 97750- Physical Performance Testing, 97110-Therapeutic exercises, 97530- Therapeutic activity, [id number]- Neuromuscular re-education, 97535- Self Care, [id number]- Manual therapy, [id number]- Electrical stimulation (unattended), [id number]-  Electrical stimulation (manual), 79439 (1-2 muscles), 20561 (3+ muscles)- Dry Needling, Patient/Family education, Joint mobilization, Joint manipulation, Spinal manipulation, Spinal mobilization, Cryotherapy, and Moist heat.  PLAN FOR NEXT SESSION: Reassess and 30 sec chair stands. Trigger point release and massage of right lumbar paraspinal. Core and hip strengthening: standing marches and palloff press using cable machine.    Toribio Servant PT, DPT  [facility] [address] [place], [address] Phone: [phone]   Fax:  [phone]

## 2024-02-19 ENCOUNTER — Ambulatory Visit: Admitting: Physical Therapy

## 2024-02-19 DIAGNOSIS — M6281 Muscle weakness (generalized): Secondary | ICD-10-CM | POA: Diagnosis not present

## 2024-02-19 DIAGNOSIS — M5459 Other low back pain: Secondary | ICD-10-CM

## 2024-02-19 NOTE — Therapy (Signed)
 " OUTPATIENT PHYSICAL THERAPY THORACOLUMBAR DISCHARGE     Patient Name: Leslie Gomez MRN: [id number] DOB:1966-01-31, 58 y.o., female Today's Date: 02/19/2024  END OF SESSION:  PT End of Session - 02/19/24 1608     Visit Number 7    Number of Visits 17    Date for Recertification  02/27/24    Authorization Type [id number] VL    Authorization - Visit Number 7    Authorization - Number of Visits 17    Progress Note Due on Visit 10    PT Start Time 1605    PT Stop Time 1645    PT Time Calculation (min) 40 min    Activity Tolerance Patient tolerated treatment well    Behavior During Therapy WFL for tasks assessed/performed            Past Medical History:  Diagnosis Date   Anemia    NOS since childhood runs in family   Hyperlipidemia    Past Surgical History:  Procedure Laterality Date   ABDOMINOPLASTY  2018   CESAREAN SECTION     COSMETIC SURGERY     WISDOM TOOTH EXTRACTION     Patient Active Problem List   Diagnosis Date Noted   Concussion 12/05/2023   Back pain 12/05/2023   History of fall 12/05/2023   Thrombocytopenia 09/02/2019   Prediabetes 08/27/2017   Colon cancer screening 05/18/2015   Encounter for routine gynecological examination 01/17/2011   Routine general medical examination at a health care facility 01/09/2011   HYPERCHOLESTEROLEMIA 11/19/2006   Anemia 11/19/2006    PCP: Randeen Laine LABOR, MD  REFERRING PROVIDER: Randeen Laine LABOR, MD  REFERRING DIAG:  [phone] (ICD-10-CM) - History of fall M54.9 (ICD-10-CM) - Acute back pain, unspecified back location, unspecified back pain laterality  Rationale for Evaluation and Treatment: Rehabilitation  THERAPY DIAG:  Muscle weakness (generalized)  Other low back pain  ONSET DATE: 12/01/23 following fall down stairs   SUBJECTIVE:                                                                                                                                                                                            SUBJECTIVE STATEMENT:    Pt reports that exercises have been helping a lot and that she feels back to her old self.   PERTINENT HISTORY:  On 12/02/23 was seen in the ED, patient had a slip and fall down stairs on 10/11. She complained of headache, back pain, and neck pain. She reported hitting her head and back on the stairs. Imaging was negative for acute fracture. Treated as concussion without loss of  consciousness. Continues with ongoing headache, ringing in ears, and back pain.   PAIN:  Are you having pain? Yes: NPRS scale: 0/10 NRPS   Pain location: L lower back Pain description: aching  Aggravating factors: sitting, standing too long, first thing in the morning when she is getting up Relieving factors: laying flat in living floor, hot shower, taking Aleve, prescribed muscle relaxer but this makes her drowsy.  PRECAUTIONS: None  RED FLAGS: None   WEIGHT BEARING RESTRICTIONS: No  FALLS:  Has patient fallen in last 6 months? Yes. 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  DIAGNOSTIC FINDINGS:  All imaging negative from ED visit on 12/02/23  PATIENT SURVEYS:  Modified Oswestry:  MODIFIED OSWESTRY DISABILITY SCALE  Date: 01/02/24 Score  Pain intensity 1 = The pain is bad, but I can manage without having to take pain medication  2. Personal care (washing, dressing, etc.) 0 =  I can take care of myself normally without causing increased pain.  3. Lifting 4 = I can lift only very light weights  4. Walking 2 =  Pain  prevents me from walking more than  mile.  5. Sitting 4 =  Pain prevents me from sitting more than 10 minutes.  6. Standing 1 =  I can stand as long as I want but, it increases my pain.  7. Sleeping 2 =  Even when I take pain medication, I sleep less than 6 hours  8. Social Life 4 =  Pain has restricted my social life to my home  9. Traveling 4 = My pain restricts my travel to short necessary journeys under 1/2 hour.  10. Employment/ Homemaking 4 = Pain prevents me from doing even light duties.  Total 26/50 = 52%   Interpretation of scores: Score Category Description  0-20% Minimal Disability The patient can cope with most living activities. Usually no treatment is indicated apart from advice on lifting, sitting and exercise  21-40% Moderate Disability The patient experiences more pain and difficulty with sitting, lifting and standing. Travel and social life are more difficult and they may be disabled from work. Personal care, sexual activity and sleeping are not grossly affected, and the patient can usually be managed by conservative means  41-60% Severe Disability Pain remains the main problem in this group, but activities of daily living are affected. These patients require a detailed investigation  61-80% Crippled Back pain impinges on all aspects of the patients life. Positive intervention is required  81-100% Bed-bound These patients are either bed-bound or exaggerating their symptoms  Bluford FORBES Zoe DELENA Karon DELENA, et al. Surgery versus conservative management of stable thoracolumbar fracture: the PRESTO feasibility RCT. [place] ([place]): [employer]; 2021 Nov. [facility] Technology Assessment, No. 25.62.) Appendix 3, Oswestry Disability Index category descriptors. Available from: [email or web address].cz  Minimally Clinically Important Difference (MCID) = 12.8%  COGNITION: Overall cognitive status: Within functional limits for tasks  assessed     SENSATION: WFL  MUSCLE LENGTH: Hamstrings length limited bilaterally  POSTURE: No Significant postural limitations  PALPATION: Significant TTP  to L>R paraspinals (lumbar and thoracic) Unable to assess passive accessory motions of lumbar/thoracic spine   LUMBAR ROM:   AROM eval 01/24/24  Flexion 50%* 75%*  Extension 75%* 100%  Right lateral flexion Fingertips to 3/4 of thigh  100%*  Left lateral flexion Fingertips to 3/4 of thigh* 100%  Right rotation 25%* 75%*  Left rotation 25%* 75%*   (Blank rows = not tested)  LOWER EXTREMITY MMT:    MMT Right eval Left eval Left  02/19/24   Hip flexion 4 4- 4  Hip extension     Hip abduction 4 4   Hip adduction 4+ 4+   Hip internal rotation     Hip external rotation     Knee flexion 4+ 4+   Knee extension 4+ 4   Ankle dorsiflexion 4+ 4+   Ankle plantarflexion     Ankle inversion     Ankle eversion      (Blank rows = not tested)  LUMBAR SPECIAL TESTS:  None tested   FUNCTIONAL TESTS:  30 seconds chair stand test: to be completed at visit #2; 01/07/24: 3 reps  6 minute walk test: to be completed visit #2; 01/07/24: 1,052'  GAIT: Distance walked: 62' Assistive device utilized: None Level of assistance: Complete Independence Comments: no significant gait deviations   TREATMENT DATE:  02/19/24: PHYSICAL PERFORMANCE   : 1,500 ft    30 sec chair stands:  10 reps     THEREX   Lumber ROM  -Flexion 100% -Extension 100% -Side Bending R/L 100%/100% -Rotation R/L 100%/100%   Hip Flex L 4+ Review of home exercise plan including that she only needs to perform mobility exercises and to exclude quadruped hip extension, because she already has an exercise routine.                                                                                                                              PATIENT EDUCATION:  Education details: HEP, POC, goals Person educated: Patient Education method: Explanation,  Demonstration, and Handouts Education comprehension: verbalized understanding and returned demonstration  HOME EXERCISE PROGRAM: Access Code: [email or web address] URL: [email or web address] Date: 02/19/2024 Prepared by: Toribio Servant  Exercises - Supine Lower Trunk Rotation  - 1-3 x daily - 7 x weekly - 10 reps - 5-10 second hold - Cat Cow  - 1 x daily - 7 x weekly - 2 sets - 10 reps - 3 sec hold - Child's Pose  - 1 x daily - 7 x weekly - 3 reps - 30-60 sec hold - Child's Pose with Sidebending  - 1 x daily - 7 x weekly - 3 reps - 30-60 sec hold - Child's Pose with Sidebending (Mirrored)  - 1 x daily - 7 x weekly - 3 reps - 30-60 sec hold - Sidelying Thoracic Rotation with Open Book  - 1-3 x daily - 7 x weekly - 10 reps - 5-10 second hold - Seated Hamstring Stretch  -  1-3 x daily - 7 x weekly - 3-5 reps - 30-60 seconds hold - Seated Flexion Stretch with Swiss Ball  - 1-3 x daily - 7 x weekly - 10 reps - 5-10 second hold  ASSESSMENT:  CLINICAL IMPRESSION:   Pt has now met all of her rehab goals with a decrease in her low back pain and improvement in her LE strength and lumbar mobility. She is now ready for discharge and she has been educated on a home exercise plan which emphasis a focus on lumbar and hip mobility to maintain gains from rehab.    OBJECTIVE IMPAIRMENTS: decreased activity tolerance, decreased endurance, decreased mobility, difficulty walking, decreased strength, hypomobility, increased muscle spasms, impaired flexibility, and pain.   ACTIVITY LIMITATIONS: sitting, standing, sleeping, and locomotion level  PARTICIPATION LIMITATIONS: cleaning, laundry, community activity, occupation, and yard work  PERSONAL FACTORS: Age, Past/current experiences, and Time since onset of injury/illness/exacerbation are also affecting patient's functional outcome.   REHAB POTENTIAL: Good  CLINICAL DECISION MAKING: Stable/uncomplicated  EVALUATION COMPLEXITY: Low   GOALS: Goals  reviewed with patient? Yes  SHORT TERM GOALS: Target date: 01/30/2024  Patient will be independent in HEP to improve strength/mobility for better functional independence with ADLs.  Baseline: 01/02/24: HEP initiated 01/24/24: Performing independently Goal status: ACHIEVED     LONG TERM GOALS: Target date: 02/27/2024  Patient will reduce modified Oswestry score to <20 as to demonstrate minimal disability with ADLs including improved sleeping tolerance, walking/sitting tolerance etc for better mobility with ADLs. Baseline: 01/02/24: 26/50 = 52%    01/24/24: 12/50 (24%) Goal status: ACHIEVED    2.  Patient will report a worst pain of <3/10 on NRPS in low back  to improve tolerance with ADLs and reduced symptoms with activities.   Baseline: 01/02/24: 9/10 pain  01/24/24: 4/10 in central spinous  process  02/19/24: 1/10 NREPS   Goal status: ACHIEVED     3.  Patient will be able to perform household work/ chores without increase in symptoms to >2/10 pain.  Baseline: 01/02/24: unable to stand and complete dishes for >10 minutes before laying down  01/24/24: 5/10 NRPS , but she does not have to lay down anymore to take breaks.  02/19/24: Able to perform chores without an increase in pain (0/10 NRPS)  Goal status: ACHIEVED    4.  Patient will increase lumbar ROM to WNL to demonstrate improve muscular tightness and tolerance to movement. Baseline: 01/02/24: see above, limited due to pain and muscular tightness12/4/25: Progressing see lumbar ROM above   Goal status: PROGRESSING    5.  Patient will increase 30 second chair stand reps by 4 reps to demonstrate improved functional BLE strength and tolerance to activity.  Baseline: 01/02/24: to be completed visit #2; 01/07/24: 3 reps  02/19/24: 10 reps   Goal status: ACHIEVED   6.  Patient will improve by 38m (164') in order to demonstrate clinically significant improvement in cardiopulmonary endurance and community ambulation  Baseline: 01/02/24: to  be completed visit #2 ; 01/07/24: 1,052' 02/19/24: 1,500 ft    Goal status: ACHIEVED   PLAN:  PT FREQUENCY: 1-2x/week  PT DURATION: 8 weeks  PLANNED INTERVENTIONS: 97164- PT Re-evaluation, 97750- Physical Performance Testing, 97110-Therapeutic exercises, 97530- Therapeutic activity, 97112- Neuromuscular re-education, 97535- Self Care, 02859- Manual therapy, [id number]- Electrical stimulation (unattended), [id number]- Electrical stimulation (manual), 20560 (1-2 muscles), 20561 (3+ muscles)- Dry Needling, Patient/Family education, Joint mobilization, Joint manipulation, Spinal manipulation, Spinal mobilization, Cryotherapy, and Moist heat.  PLAN FOR NEXT SESSION: Discharge  from PT    Toribio Servant PT, DPT  [facility] [address] [place], [address] Phone: [phone]   Fax:  [phone]  "

## 2024-02-25 ENCOUNTER — Ambulatory Visit

## 2024-02-27 ENCOUNTER — Ambulatory Visit

## 2024-10-07 ENCOUNTER — Encounter: Payer: Self-pay | Admitting: Family Medicine

## 2024-10-10 ENCOUNTER — Encounter: Payer: Self-pay | Admitting: Family Medicine
